# Patient Record
Sex: Female | Born: 1990 | Race: White | Hispanic: No | Marital: Married | Smoking: Never smoker
Health system: Southern US, Community
[De-identification: ages and names within clinical notes are randomized; demographics above are authoritative.]

## PROBLEM LIST (undated history)

## (undated) DIAGNOSIS — Z9889 Other specified postprocedural states: Secondary | ICD-10-CM

## (undated) DIAGNOSIS — R112 Nausea with vomiting, unspecified: Secondary | ICD-10-CM

## (undated) DIAGNOSIS — O2230 Deep phlebothrombosis in pregnancy, unspecified trimester: Secondary | ICD-10-CM

## (undated) DIAGNOSIS — E079 Disorder of thyroid, unspecified: Secondary | ICD-10-CM

## (undated) HISTORY — DX: Other specified postprocedural states: Z98.890

## (undated) HISTORY — DX: Deep phlebothrombosis in pregnancy, unspecified trimester: O22.30

## (undated) HISTORY — PX: FOOT SURGERY: SHX648

## (undated) HISTORY — DX: Other specified postprocedural states: R11.2

---

## 2013-04-08 DIAGNOSIS — F411 Generalized anxiety disorder: Secondary | ICD-10-CM | POA: Insufficient documentation

## 2018-04-05 DIAGNOSIS — D069 Carcinoma in situ of cervix, unspecified: Secondary | ICD-10-CM | POA: Insufficient documentation

## 2020-06-30 ENCOUNTER — Other Ambulatory Visit: Payer: Self-pay

## 2020-06-30 ENCOUNTER — Ambulatory Visit (HOSPITAL_COMMUNITY)
Admission: EM | Admit: 2020-06-30 | Discharge: 2020-06-30 | Disposition: A | Discharge status: Home / Self Care | Payer: 59

## 2020-06-30 ENCOUNTER — Encounter (HOSPITAL_COMMUNITY): Payer: Self-pay | Admitting: Emergency Medicine

## 2020-06-30 ENCOUNTER — Emergency Department (HOSPITAL_COMMUNITY)
Admission: EM | Admit: 2020-06-30 | Discharge: 2020-06-30 | Disposition: A | Discharge status: Home / Self Care | Payer: 59 | Attending: Emergency Medicine | Admitting: Emergency Medicine

## 2020-06-30 DIAGNOSIS — M79604 Pain in right leg: Secondary | ICD-10-CM | POA: Diagnosis present

## 2020-06-30 HISTORY — DX: Disorder of thyroid, unspecified: E07.9

## 2020-06-30 NOTE — Discharge Instructions (Signed)
Please read and follow all provided instructions.  Your diagnoses today include:  1. Right leg pain     Tests performed today include: Vital signs. See below for your results today.   Medications prescribed:  None  Take any prescribed medications only as directed.  Additional Information:  Follow any educational materials contained in this packet.  Although you appear stable for discharge, you may still have a blood clot in your leg(s) called a DVT (deep venous thrombosis). You may have received initial treatment with an injection of a blood thinner to treat DVTs, but low risk patients do not always get treated before an ultrasound is performed to diagnose or rule out a DVT, due to the risks of bleeding from the medication used. It is important you follow up for an ultrasound within one day as directed.  Follow-up instructions: Please return to the hospital tomorrow morning for your ultrasound as directed.  Return instructions:  Please return to the Emergency Department if you experience worsening symptoms. Return immediately if you develop chest pain, shortness of breath, dizziness or fainting. Return with new color change or weakness/numbness to your affected leg(s). Return with bleeding, severe headache, confusion or altered mental status. Please return if you have any other emergent concerns.  Additional Information:  Your vital signs today were: BP 132/77 (BP Location: Right Arm)   Pulse 87   Temp 98.7 F (37.1 C) (Oral)   Resp 15   SpO2 100%  If your blood pressure (BP) was elevated above 135/85 this visit, please have this repeated by your doctor within one month. --------------

## 2020-06-30 NOTE — ED Provider Notes (Signed)
[facility]-EMERGENCY DEPT Provider Note   CSN: [id number] Arrival date & time: 06/30/20  1829     History Chief Complaint  Patient presents with   Leg Pain    Kathryn Gill is a 30 y.o. female.  Patient presents the emergency department for evaluation of right leg discomfort.  Patient states that she has noted an indentation in the right thigh area over the past 1 week.  She denies trauma to the area.  Today she had pain and discomfort in his area as well as down to her calf.  No significant swelling.  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  No chest pain or shortness of breath. Patient denies risk factors for pulmonary embolism/DVT including: unilateral leg swelling, history of DVT/PE/other blood clots, use of exogenous hormones, recent immobilizations, recent surgery, recent travel (>4hr segment), malignancy, hemoptysis.  She went to urgent care prior to arrival.        Past Medical History:  Diagnosis Date   Thyroid disease     There are no problems to display for this patient.   History reviewed. No pertinent surgical history.   OB History   No obstetric history on file.     No family history on file.  Social History   Tobacco Use   Smoking status: Never   Smokeless tobacco: Never  Substance Use Topics   Alcohol use: Yes   Drug use: Never    Home Medications Prior to Admission medications   Medication Sig Start Date End Date Taking? Authorizing Provider  levothyroxine (SYNTHROID) 25 MCG tablet Take by mouth. 06/12/20 06/12/21  [provider]    Allergies    Penicillins  Review of Systems   Review of Systems  Cardiovascular:  Negative for leg swelling.  Musculoskeletal:  Positive for myalgias.  Skin:  Negative for color change.   Physical Exam Updated Vital Signs BP 132/77 (BP Location: Right Arm)   Pulse 87   Temp 98.7 F (37.1 C) (Oral)   Resp 15   SpO2 100%   Physical Exam Vitals and nursing note reviewed.   Constitutional:      Appearance: She is well-developed.  HENT:     Head: Normocephalic and atraumatic.  Eyes:     Conjunctiva/sclera: Conjunctivae normal.  Pulmonary:     Effort: No respiratory distress.  Musculoskeletal:        General: No tenderness.     Cervical back: Normal range of motion and neck supple.     Comments: Patient with a small varicosity in the area where she has noted the indentation, but no definite skin changes otherwise.  Leg is not grossly swollen.  No other significant calf tenderness or thigh tenderness on exam.  No erythema or signs of cellulitis/abscess.  Skin:    General: Skin is warm and dry.  Neurological:     Mental Status: She is alert.    ED Results / Procedures / Treatments   Labs (all labs ordered are listed, but only abnormal results are displayed) Labs Reviewed - No data to display  EKG None  Radiology No results found.  Procedures Procedures   Medications Ordered in ED Medications - No data to display  ED Course  I have reviewed the triage vital signs and the nursing notes.  Pertinent labs & imaging results that were available during my care of the patient were reviewed by me and considered in my medical decision making (see chart for details).  Patient seen and examined.  Unable to obtain Doppler ultrasound as it is past 7 PM.  Patient will be scheduled for imaging tomorrow.  We discussed giving a dose of subcu Lovenox, however she is very low risk and does not have significant risk factors for DVT.  She defers prophylactic treatment at this time and I think this is reasonable.  Vital signs reviewed and are as follows: BP 132/77 (BP Location: Right Arm)   Pulse 87   Temp 98.7 F (37.1 C) (Oral)   Resp 15   SpO2 100%     MDM Rules/Calculators/A&P                          Patient with right leg discomfort, rule out DVT.  Plan as above.  She looks well.   Final Clinical Impression(s) / ED Diagnoses Final diagnoses:   Right leg pain    Rx / DC Orders ED Discharge Orders          Ordered    LE VENOUS        06/30/20 1934             Renne Crigler, Cordelia Poche 06/30/20 1939    Gwyneth Sprout, MD 07/01/20 [phone]

## 2020-06-30 NOTE — ED Triage Notes (Signed)
Patient reports indention to right leg x1 week. States today she noticed pain at site and down leg.

## 2020-06-30 NOTE — ED Provider Notes (Signed)
Emergency Medicine Provider Triage Evaluation Note  Kathryn Gill , a 30 y.o. female  was evaluated in triage.  Pt complains of right thigh and right calf pain.  Denies any mechanical trauma, but she works all day as a Engineer, civil (consulting) in the maternity ward.  She says that about a week ago she noticed an indentation to her thigh      she did not have any difficulty walking or ambulating but she does have right hip pain as well that started today and is intermittent.   She does not smoke, no recent travel, not on oral birth control, no recent surgeries.  Review of Systems  Positive: Right hip pain, right thigh pain, right calf pain Negative: No chest pain, shortness of breath  Physical Exam  BP 132/77 (BP Location: Right Arm)   Pulse 87   Temp 98.7 F (37.1 C) (Oral)   Resp 15   SpO2 100%  Gen:   Awake, no distress   Resp:  Normal effort  MSK:   Moves extremities without difficulty  Other:  No calf swelling.  Homan negative.  DP and PT pulses 2+ bilaterally.  No point tenderness to hip, knee, ankle.  Medical Decision Making  Medically screening exam initiated at 7:06 PM.  Appropriate orders placed.  Kathryn Gill was informed that the remainder of the evaluation will be completed by another provider, this initial triage assessment does not replace that evaluation, and the importance of remaining in the ED until their evaluation is complete.  Unable to order venous ultrasound because the tech is left already.  She was evaluated at urgent care a few hours before coming to the emergency department.   Theron Arista, PA-C 06/30/20 1909    Tilden Fossa, MD 07/01/20 [phone]

## 2020-06-30 NOTE — ED Provider Notes (Signed)
[facility]    CSN: [id number] Arrival date & time: 06/30/20  1650      History   Chief Complaint Chief Complaint  Patient presents with   Thigh Pain    HPI Kathryn Gill is a 30 y.o. female.   Patient here for evaluation of left thigh, knee, and calf pain that has been intermittent for the past several days.  Reports first noticed thigh swelling approximately a week ago.  Denies any numbness or tingling.  Reports pain is intermittent but does not appear to be related to movement.  Denies any trauma, injury, or other precipitating event.  Denies any specific alleviating or aggravating factors.  Denies any fevers, chest pain, shortness of breath, N/V/D, numbness, tingling, weakness, abdominal pain, or headaches.     The history is provided by the patient.   Past Medical History:  Diagnosis Date   Thyroid disease     There are no problems to display for this patient.   History reviewed. No pertinent surgical history.  OB History   No obstetric history on file.      Home Medications    Prior to Admission medications   Medication Sig Start Date End Date Taking? Authorizing Provider  levothyroxine (SYNTHROID) 25 MCG tablet Take by mouth. 06/12/20 06/12/21 Yes [provider]    Family History History reviewed. No pertinent family history.  Social History Social History   Tobacco Use   Smoking status: Never   Smokeless tobacco: Never  Substance Use Topics   Alcohol use: Yes   Drug use: Never     Allergies   Penicillins   Review of Systems Review of Systems  Musculoskeletal:  Positive for joint swelling and myalgias.  All other systems reviewed and are negative.   Physical Exam Triage Vital Signs ED Triage Vitals  Enc Vitals Group     BP 06/30/20 1722 128/83     Pulse Rate 06/30/20 1722 91     Resp 06/30/20 1722 16     Temp 06/30/20 1722 98 F (36.7 C)     Temp Source 06/30/20 1722 Oral     SpO2 06/30/20 1722 100 %     Weight  --      Height --      Head Circumference --      Peak Flow --      Pain Score 06/30/20 1719 3     Pain Loc --      Pain Edu? --      Excl. in GC? --    No data found.  Updated Vital Signs BP 128/83 (BP Location: Right Arm)   Pulse 91   Temp 98 F (36.7 C) (Oral)   Resp 16   SpO2 100%   Breastfeeding Yes   Visual Acuity Right Eye Distance:   Left Eye Distance:   Bilateral Distance:    Right Eye Near:   Left Eye Near:    Bilateral Near:     Physical Exam Vitals and nursing note reviewed.  Constitutional:      General: She is not in acute distress.    Appearance: Normal appearance. She is not ill-appearing, toxic-appearing or diaphoretic.  HENT:     Head: Normocephalic and atraumatic.  Eyes:     Conjunctiva/sclera: Conjunctivae normal.  Cardiovascular:     Rate and Rhythm: Normal rate.     Pulses: Normal pulses.  Pulmonary:     Effort: Pulmonary effort is normal.  Abdominal:     General:  Abdomen is flat.  Musculoskeletal:        General: Normal range of motion.     Cervical back: Normal range of motion.     Right upper leg: No lacerations, tenderness or bony tenderness.     Left upper leg: No deformity, lacerations, tenderness or bony tenderness.     Right knee: No swelling or bony tenderness. Normal range of motion. No tenderness.     Instability Tests: Lateral McMurray test negative.     Left knee: Normal. No swelling or bony tenderness. Normal range of motion. No tenderness.     Right lower leg: Normal. No swelling. No edema.     Left lower leg: Normal. No swelling. No edema.     Comments: Right thigh slightly larger than left, no ecchymosis, redness, or erythema noted Homan's sign negative  Skin:    General: Skin is warm and dry.  Neurological:     General: No focal deficit present.     Mental Status: She is alert and oriented to person, place, and time.  Psychiatric:        Mood and Affect: Mood normal.     UC Treatments / Results  Labs (all  labs ordered are listed, but only abnormal results are displayed) Labs Reviewed - No data to display  EKG   Radiology No results found.  Procedures Procedures (including critical care time)  Medications Ordered in UC Medications - No data to display  Initial Impression / Assessment and Plan / UC Course  I have reviewed the triage vital signs and the nursing notes.  Pertinent labs & imaging results that were available during my care of the patient were reviewed by me and considered in my medical decision making (see chart for details).  Assessment negative for red flags or concerns.  Based on Wells criteria likelihood of a DVT is low.  Discussed this with patient but stated that an ultrasound is the only way to definitively rule out a DVT.  At this point recommend ibuprofen 3 times a day as needed for pain and swelling.  Encourage rest, ice, compression, and elevation.  Patient may follow-up with PCP or go to the emergency room for further evaluation if symptoms do not improve in the next few days. Final Clinical Impressions(s) / UC Diagnoses   Final diagnoses:  Pain of right lower extremity     Discharge Instructions      Take Ibuprofen (600-800mg ) three times a day as needed for pain and swelling.    Rest as much as possible Ice for 10-15 minutes every 4-6 hours as needed for pain and swelling Compression- use an ace bandage or splint for comfort Elevate above your hip/heart when sitting and laying down  Return or go to the Emergency Department if symptoms worsen or do not improve in the next few days.      ED Prescriptions   None    PDMP not reviewed this encounter.   Ivette Loyal, NP 06/30/20 1831

## 2020-06-30 NOTE — Discharge Instructions (Addendum)
Take Ibuprofen (600-800mg ) three times a day as needed for pain and swelling.    Rest as much as possible Ice for 10-15 minutes every 4-6 hours as needed for pain and swelling Compression- use an ace bandage or splint for comfort Elevate above your hip/heart when sitting and laying down  Return or go to the Emergency Department if symptoms worsen or do not improve in the next few days.

## 2020-06-30 NOTE — ED Triage Notes (Signed)
Pt presents with leg pain xs 1 week. States feels an "indentation" in her right thigh that seems to have gotten worse. States is have pain in her knee and calf. Denies and numbness.

## 2020-07-01 ENCOUNTER — Other Ambulatory Visit: Payer: Self-pay

## 2020-07-01 ENCOUNTER — Ambulatory Visit (HOSPITAL_BASED_OUTPATIENT_CLINIC_OR_DEPARTMENT_OTHER)
Admission: RE | Admit: 2020-07-01 | Discharge: 2020-07-01 | Disposition: A | Discharge status: Home / Self Care | Payer: 59 | Source: Ambulatory Visit

## 2020-07-01 ENCOUNTER — Encounter (HOSPITAL_COMMUNITY): Payer: Self-pay | Admitting: Pharmacy Technician

## 2020-07-01 ENCOUNTER — Emergency Department (HOSPITAL_COMMUNITY)
Admission: EM | Admit: 2020-07-01 | Discharge: 2020-07-01 | Disposition: A | Discharge status: Home / Self Care | Payer: 59 | Attending: Emergency Medicine | Admitting: Emergency Medicine

## 2020-07-01 DIAGNOSIS — M7989 Other specified soft tissue disorders: Secondary | ICD-10-CM | POA: Diagnosis present

## 2020-07-01 DIAGNOSIS — M79661 Pain in right lower leg: Secondary | ICD-10-CM

## 2020-07-01 DIAGNOSIS — I82442 Acute embolism and thrombosis of left tibial vein: Secondary | ICD-10-CM | POA: Diagnosis not present

## 2020-07-01 DIAGNOSIS — I82441 Acute embolism and thrombosis of right tibial vein: Secondary | ICD-10-CM

## 2020-07-01 DIAGNOSIS — I82409 Acute embolism and thrombosis of unspecified deep veins of unspecified lower extremity: Secondary | ICD-10-CM | POA: Insufficient documentation

## 2020-07-01 DIAGNOSIS — I82401 Acute embolism and thrombosis of unspecified deep veins of right lower extremity: Secondary | ICD-10-CM | POA: Insufficient documentation

## 2020-07-01 LAB — I-STAT BETA HCG BLOOD, ED (MC, WL, AP ONLY): I-stat hCG, quantitative: <5 m[IU]/mL (ref <5)

## 2020-07-01 MED ORDER — RIVAROXABAN (XARELTO) VTE STARTER PACK (15 & 20 MG): ORAL_TABLET | ORAL | 0 refills | Status: DC

## 2020-07-01 NOTE — ED Provider Notes (Signed)
[facility] EMERGENCY DEPARTMENT Provider Note   CSN: [id number] Arrival date & time: 07/01/20  1157     History No chief complaint on file.   Elvis Boot is a 30 y.o. female.  Worsening leg swelling after the last few weeks.  Is 38 weeks postpartum.  No recent pregnancy that she knows of does not use birth control is sexually active.  No shortness of breath no chest pain.  No significant color change.  Was seen yesterday ultrasound was unavailable she went home and came back today for ultrasound and was told it was positive and come back here.  She denies numbness tingling color change or trauma.  No family history of patient risk factor revealed no provoked factors to cause DVT       Past Medical History:  Diagnosis Date   Thyroid disease     There are no problems to display for this patient.   History reviewed. No pertinent surgical history.   OB History   No obstetric history on file.     No family history on file.  Social History   Tobacco Use   Smoking status: Never   Smokeless tobacco: Never  Substance Use Topics   Alcohol use: Yes   Drug use: Never    Home Medications Prior to Admission medications   Medication Sig Start Date End Date Taking? Authorizing Provider  RIVAROXABAN Carlena Hurl) VTE STARTER PACK (15 & 20 MG) Follow package directions: Take one 15mg  tablet by mouth twice a day. On day 22, switch to one 20mg  tablet once a day. Take with food. 07/01/20  Yes , MD  levothyroxine (SYNTHROID) 25 MCG tablet Take by mouth. 06/12/20 06/12/21  [provider]    Allergies    Penicillins  Review of Systems   Review of Systems  Constitutional:  Negative for chills and fever.  HENT:  Negative for congestion and rhinorrhea.   Respiratory:  Negative for cough and shortness of breath.   Cardiovascular:  Positive for leg swelling. Negative for chest pain and palpitations.  Gastrointestinal:  Negative for diarrhea, nausea  and vomiting.  Genitourinary:  Negative for difficulty urinating and dysuria.  Musculoskeletal:  Negative for arthralgias and back pain.  Skin:  Negative for rash and wound.  Neurological:  Negative for light-headedness and headaches.   Physical Exam Updated Vital Signs BP 140/77 (BP Location: Right Arm)   Pulse 81   Temp 98.4 F (36.9 C)   Resp 18   SpO2 99%   Physical Exam Vitals and nursing note reviewed. Exam conducted with a chaperone present.  Constitutional:      General: She is not in acute distress.    Appearance: Normal appearance.  HENT:     Head: Normocephalic and atraumatic.     Nose: No rhinorrhea.  Eyes:     General:        Right eye: No discharge.        Left eye: No discharge.     Conjunctiva/sclera: Conjunctivae normal.  Cardiovascular:     Rate and Rhythm: Normal rate and regular rhythm.  Pulmonary:     Effort: Pulmonary effort is normal. No respiratory distress.     Breath sounds: No stridor.  Abdominal:     General: Abdomen is flat. There is no distension.     Palpations: Abdomen is soft.  Musculoskeletal:        General: No tenderness or signs of injury.     Comments: Slightly larger  circumferential diameter of the right lower extremity compared to the left.  Intact neurovascular status of both legs no bony tenderness normal range of motion.  No significant color change  Skin:    General: Skin is warm and dry.  Neurological:     General: No focal deficit present.     Mental Status: She is alert. Mental status is at baseline.     Motor: No weakness.  Psychiatric:        Mood and Affect: Mood normal.        Behavior: Behavior normal.    ED Results / Procedures / Treatments   Labs (all labs ordered are listed, but only abnormal results are displayed) Labs Reviewed  I-STAT BETA HCG BLOOD, ED ([facility], [facility], [facility] ONLY)    EKG None  Radiology LE VENOUS  Result Date: 07/01/2020  Lower Venous DVT Study Patient Name:  Fiana Rinella  Date of Exam:    07/01/2020 Medical Rec #: [id number]    Accession #:    [id number] Date of Birth: 1990/04/07    Patient Gender: F Patient Age:   63Y Exam Location:  [facility] Procedure:      VAS [place] LOWER EXTREMITY VENOUS (DVT) Referring Phys: [id number] JOSHUA GEIPLE --------------------------------------------------------------------------------  Indications: Right leg discomfort from lateral thigh into calf. No edema is appreciated in the calf.  Comparison Study: No prior study on file Performing Technologist: Sherren Kerns RVS  Examination Guidelines: A complete evaluation includes B-mode imaging, spectral Doppler, color Doppler, and power Doppler as needed of all accessible portions of each vessel. Bilateral testing is considered an integral part of a complete examination. Limited examinations for reoccurring indications may be performed as noted. The reflux portion of the exam is performed with the patient in reverse Trendelenburg.  +---------+---------------+---------+-----------+----------+-------------------+ RIGHT    CompressibilityPhasicitySpontaneityPropertiesThrombus Aging      +---------+---------------+---------+-----------+----------+-------------------+ CFV      Full           Yes      Yes                                      +---------+---------------+---------+-----------+----------+-------------------+ SFJ      Full                                                             +---------+---------------+---------+-----------+----------+-------------------+ FV Prox  Full                                                             +---------+---------------+---------+-----------+----------+-------------------+ FV Mid   Full                                                             +---------+---------------+---------+-----------+----------+-------------------+ FV DistalFull                                                              +---------+---------------+---------+-----------+----------+-------------------+  PFV      Full                                                             +---------+---------------+---------+-----------+----------+-------------------+ POP      Full           Yes      Yes                                      +---------+---------------+---------+-----------+----------+-------------------+ PTV      None                                         acute in one of the                                                       paired veins        +---------+---------------+---------+-----------+----------+-------------------+ PERO     None                                         acute in one of the                                                       paired veins        +---------+---------------+---------+-----------+----------+-------------------+   +----+---------------+---------+-----------+----------+--------------+ LEFTCompressibilityPhasicitySpontaneityPropertiesThrombus Aging +----+---------------+---------+-----------+----------+--------------+ CFV Full           Yes      Yes                                 +----+---------------+---------+-----------+----------+--------------+     Summary: RIGHT: - Findings consistent with acute deep vein thrombosis involving one of the paired veins of the right posterior tibial veins, and right peroneal veins. LEFT: - No evidence of common femoral vein obstruction.  *See table(s) above for measurements and observations.    Preliminary     Procedures Procedures   Medications Ordered in ED Medications - No data to display  ED Course  I have reviewed the triage vital signs and the nursing notes.  Pertinent labs & imaging results that were available during my care of the patient were reviewed by me and considered in my medical decision making (see chart for details).    MDM Rules/Calculators/A&P                           Vascular study consistent with DVT.  Patient is to give [place] pregnancy test and determine best course of treatment.  She has breast-feeding literature review shows safety of Xarelto Lovenox.  If pregnant she will likely  get Lovenox if not she will likely get Xarelto  Pregnancy test is negative.  Xarelto starter pack prescribed.  Return precautions discussed Final Clinical Impression(s) / ED Diagnoses Final diagnoses:  Acute deep vein thrombosis (DVT) of tibial vein of right lower extremity (HCC)    Rx / DC Orders ED Discharge Orders          Ordered    RIVAROXABAN (XARELTO) VTE STARTER PACK (15 & 20 MG)        07/01/20 1427             Sabino DonovanKatz, Zain Bingman C, MD 07/01/20 1429

## 2020-07-01 NOTE — ED Notes (Signed)
ED Provider at bedside. 

## 2020-07-01 NOTE — ED Triage Notes (Signed)
Pt here with reports of R sided leg swelling and pain, increased over the last few days. Pt sent here for [place] that is +DVT. Pt denies chest pain/shob.

## 2020-07-01 NOTE — Progress Notes (Signed)
VASCULAR LAB    Right lower extremity venous duplex has been performed.  See CV proc for preliminary results.   Gave verbal report to triage nurse.  Jolie Strohecker, RVT 07/01/2020, 12:13 PM

## 2020-07-02 ENCOUNTER — Other Ambulatory Visit: Payer: Self-pay

## 2020-07-02 ENCOUNTER — Encounter (HOSPITAL_COMMUNITY): Payer: Self-pay

## 2020-07-02 ENCOUNTER — Emergency Department (HOSPITAL_COMMUNITY)
Admission: EM | Admit: 2020-07-02 | Discharge: 2020-07-02 | Disposition: A | Discharge status: Home / Self Care | Payer: 59 | Attending: Emergency Medicine | Admitting: Emergency Medicine

## 2020-07-02 ENCOUNTER — Emergency Department (HOSPITAL_COMMUNITY): Payer: 59

## 2020-07-02 DIAGNOSIS — R079 Chest pain, unspecified: Secondary | ICD-10-CM | POA: Diagnosis present

## 2020-07-02 DIAGNOSIS — R0789 Other chest pain: Secondary | ICD-10-CM | POA: Insufficient documentation

## 2020-07-02 DIAGNOSIS — Z7901 Long term (current) use of anticoagulants: Secondary | ICD-10-CM | POA: Insufficient documentation

## 2020-07-02 DIAGNOSIS — I824Z1 Acute embolism and thrombosis of unspecified deep veins of right distal lower extremity: Secondary | ICD-10-CM | POA: Diagnosis not present

## 2020-07-02 DIAGNOSIS — R0602 Shortness of breath: Secondary | ICD-10-CM | POA: Insufficient documentation

## 2020-07-02 DIAGNOSIS — I82401 Acute embolism and thrombosis of unspecified deep veins of right lower extremity: Secondary | ICD-10-CM

## 2020-07-02 LAB — COMPREHENSIVE METABOLIC PANEL
ALT: 18 U/L (ref 0–44)
AST: 20 U/L (ref 15–41)
Albumin: 4.9 g/dL (ref 3.5–5.0)
Alkaline Phosphatase: 61 U/L (ref 38–126)
Anion gap: 7 (ref 5–15)
BUN: 12 mg/dL (ref 6–20)
CO2: 27 mmol/L (ref 22–32)
Calcium: 9.5 mg/dL (ref 8.9–10.3)
Chloride: 105 mmol/L (ref 98–111)
Creatinine, Ser: 0.7 mg/dL (ref 0.44–1.00)
GFR, Estimated: >60 mL/min (ref >60)
Glucose, Bld: 94 mg/dL (ref 70–99)
Potassium: 3.7 mmol/L (ref 3.5–5.1)
Sodium: 139 mmol/L (ref 135–145)
Total Bilirubin: 0.9 mg/dL (ref 0.3–1.2)
Total Protein: 8 g/dL (ref 6.5–8.1)

## 2020-07-02 LAB — URINALYSIS, ROUTINE W REFLEX MICROSCOPIC
Bilirubin Urine: NEGATIVE
Glucose, UA: NEGATIVE mg/dL
Hgb urine dipstick: NEGATIVE
Ketones, ur: 20 mg/dL — AB
Leukocytes,Ua: NEGATIVE
Nitrite: NEGATIVE
Protein, ur: NEGATIVE mg/dL
Specific Gravity, Urine: 1.012 (ref 1.005–1.030)
pH: 7 (ref 5.0–8.0)

## 2020-07-02 LAB — CBC WITH DIFFERENTIAL/PLATELET
Abs Immature Granulocytes: 0.02 10*3/uL (ref 0.00–0.07)
Basophils Absolute: 0.1 10*3/uL (ref 0.0–0.1)
Basophils Relative: 1 %
Eosinophils Absolute: 0.2 10*3/uL (ref 0.0–0.5)
Eosinophils Relative: 3 %
HCT: 43.9 % (ref 36.0–46.0)
Hemoglobin: 14.3 g/dL (ref 12.0–15.0)
Immature Granulocytes: 0 %
Lymphocytes Relative: 25 %
Lymphs Abs: 2 10*3/uL (ref 0.7–4.0)
MCH: 29.1 pg (ref 26.0–34.0)
MCHC: 32.6 g/dL (ref 30.0–36.0)
MCV: 89.2 fL (ref 80.0–100.0)
Monocytes Absolute: 0.4 10*3/uL (ref 0.1–1.0)
Monocytes Relative: 5 %
Neutro Abs: 5.3 10*3/uL (ref 1.7–7.7)
Neutrophils Relative %: 66 %
Platelets: 276 10*3/uL (ref 150–400)
RBC: 4.92 MIL/uL (ref 3.87–5.11)
RDW: 13.1 % (ref 11.5–15.5)
WBC: 8 10*3/uL (ref 4.0–10.5)
nRBC: 0 % (ref 0.0–0.2)

## 2020-07-02 LAB — LIPASE, BLOOD: Lipase: 36 U/L (ref 11–51)

## 2020-07-02 LAB — I-STAT BETA HCG BLOOD, ED (MC, WL, AP ONLY): I-stat hCG, quantitative: <5 m[IU]/mL (ref <5)

## 2020-07-02 LAB — TROPONIN I (HIGH SENSITIVITY): Troponin I (High Sensitivity): <2 ng/L (ref <18)

## 2020-07-02 MED ORDER — IOHEXOL 350 MG/ML SOLN
100.0000 mL | Freq: Once | INTRAVENOUS | Status: AC | PRN
  Administered 2020-07-02: 100 mL via INTRAVENOUS

## 2020-07-02 MED ORDER — SODIUM CHLORIDE (PF) 0.9 % IJ SOLN
INTRAMUSCULAR | Status: AC
  Filled 2020-07-02: qty 50

## 2020-07-02 NOTE — ED Provider Notes (Signed)
[facility]-EMERGENCY DEPT Provider Note   CSN: [id number] Arrival date & time: 07/02/20  1646     History Chief Complaint  Patient presents with   Chest Pain   Shortness of Breath    Kathryn Gill is a 30 y.o. female with a past medical history of thyroid disease, diagnosed yesterday with lower extremity DVT and started on Xarelto, who presents today for evaluation of chest pain. She states that she took her doses of Xarelto at 4 PM yesterday, 4 AM today, and 4 PM today and shortly before she took her 4 PM when she developed pain in the left side of her chest.  She denies any shortness of breath. She is a Engineer, civil (consulting)nurse in the NICU.  She states that when she had this pain she felt her heart rate and it was irregular without a specific pattern.  She feels like that has resolved.  During that she had shortness of breath.  Her chest still feels painful.  Does not radiate or move.   HPI     Past Medical History:  Diagnosis Date   Thyroid disease     There are no problems to display for this patient.   History reviewed. No pertinent surgical history.   OB History   No obstetric history on file.     History reviewed. No pertinent family history.  Social History   Tobacco Use   Smoking status: Never   Smokeless tobacco: Never  Substance Use Topics   Alcohol use: Yes   Drug use: Never    Home Medications Prior to Admission medications   Medication Sig Start Date End Date Taking? Authorizing Provider  levothyroxine (SYNTHROID) 25 MCG tablet Take by mouth. 06/12/20 06/12/21  [provider]  RIVAROXABAN Carlena Hurl(XARELTO) VTE STARTER PACK (15 & 20 MG) Follow package directions: Take one 15mg  tablet by mouth twice a day. On day 22, switch to one 20mg  tablet once a day. Take with food. 07/01/20   Sabino DonovanKatz, Eric C, MD    Allergies    Penicillins  Review of Systems   Review of Systems  Constitutional:  Negative for chills and fever.  Respiratory:  Positive for  shortness of breath.   Cardiovascular:  Positive for chest pain, palpitations and leg swelling.  Gastrointestinal:  Negative for diarrhea, nausea and vomiting.  Musculoskeletal:  Positive for back pain.  Neurological:  Negative for weakness and headaches.  Psychiatric/Behavioral:  The patient is nervous/anxious.   All other systems reviewed and are negative.  Physical Exam Updated Vital Signs BP 110/74   Pulse 87   Temp 98.2 F (36.8 C) (Oral)   Resp 16   Ht 5\' 7"  (1.702 m)   Wt 63.5 kg   SpO2 98%   Breastfeeding Yes   BMI 21.93 kg/m   Physical Exam Vitals and nursing note reviewed.  Constitutional:      General: She is not in acute distress.    Appearance: She is not diaphoretic.  HENT:     Head: Normocephalic and atraumatic.  Eyes:     General: No scleral icterus.       Right eye: No discharge.        Left eye: No discharge.     Conjunctiva/sclera: Conjunctivae normal.  Cardiovascular:     Rate and Rhythm: Normal rate and regular rhythm.     Heart sounds: Normal heart sounds. No murmur heard.    Comments: When patient starts talking about anxiety producing topics (such as her having  a child at home and this being the first time she has had health issues) she briefly becomes tachycardic into the 140s, this promptly resolved with distraction.  Pulmonary:     Effort: Pulmonary effort is normal. No respiratory distress.     Breath sounds: No stridor.  Abdominal:     General: There is no distension.  Musculoskeletal:        General: No deformity.     Cervical back: Normal range of motion and neck supple.     Right lower leg: Edema present.     Left lower leg: No edema.  Skin:    General: Skin is warm and dry.  Neurological:     General: No focal deficit present.     Mental Status: She is alert.     Motor: No abnormal muscle tone.  Psychiatric:        Mood and Affect: Mood is anxious.    ED Results / Procedures / Treatments   Labs (all labs ordered are listed,  but only abnormal results are displayed) Labs Reviewed  URINALYSIS, ROUTINE W REFLEX MICROSCOPIC - Abnormal; Notable for the following components:      Result Value   Color, Urine STRAW (*)    Ketones, ur 20 (*)    All other components within normal limits  COMPREHENSIVE METABOLIC PANEL  LIPASE, BLOOD  CBC WITH DIFFERENTIAL/PLATELET  I-STAT BETA HCG BLOOD, ED ([facility], [facility], [facility] ONLY)  TROPONIN I (HIGH SENSITIVITY)  TROPONIN I (HIGH SENSITIVITY)    EKG None  Radiology DG Chest 2 View  Result Date: 07/02/2020 CLINICAL DATA:  Chest pain, DVT EXAM: CHEST - 2 VIEW COMPARISON:  None. FINDINGS: The heart size and mediastinal contours are within normal limits. No focal airspace consolidation, pleural effusion, or pneumothorax. The visualized skeletal structures are unremarkable. IMPRESSION: No active cardiopulmonary disease. Electronically Signed   By: Duanne Guess D.O.   On: 07/02/2020 18:10   CT Angio Chest PE W and/or Wo Contrast  Result Date: 07/02/2020 CLINICAL DATA:  Sudden onset chest pain. Shortness of breath. Diagnosed with deep venous thrombosis yesterday and on blood thinners. Pulmonary embolus is suspected with high probability. EXAM: CT ANGIOGRAPHY CHEST WITH CONTRAST TECHNIQUE: Multidetector CT imaging of the chest was performed using the standard protocol during bolus administration of intravenous contrast. Multiplanar CT image reconstructions and MIPs were obtained to evaluate the vascular anatomy. CONTRAST:  OMNIPAQUE IOHEXOL 350 MG/ML SOLN COMPARISON:  Chest radiograph 07/02/2020 FINDINGS: Cardiovascular: Satisfactory opacification of the pulmonary arteries to the segmental level. No evidence of pulmonary embolism. Normal heart size. No pericardial effusion. Mediastinum/Nodes: No enlarged mediastinal, hilar, or axillary lymph nodes. Thyroid gland, trachea, and esophagus demonstrate no significant findings. Lungs/Pleura: Lungs are clear. No pleural effusion or pneumothorax. Upper  Abdomen: No acute abnormality. Musculoskeletal: No chest wall abnormality. No acute or significant osseous findings. Review of the MIP images confirms the above findings. IMPRESSION: No evidence of significant pulmonary embolus. No evidence of active pulmonary disease. Electronically Signed   By: Burman Nieves M.D.   On: 07/02/2020 19:49   LE VENOUS  Result Date: 07/02/2020  Lower Venous DVT Study Patient Name:  Triana Ursua  Date of Exam:   07/01/2020 Medical Rec #: [id number]    Accession #:    [id number] Date of Birth: [birth year]    Patient Gender: F Patient Age:   29Y Exam Location:  [facility] Procedure:      VAS [place] LOWER EXTREMITY VENOUS (DVT) Referring Phys: [id number] JOSHUA  GEIPLE --------------------------------------------------------------------------------  Indications: Right leg discomfort from lateral thigh into calf. No edema is appreciated in the calf.  Comparison Study: No prior study on file Performing Technologist: Sherren Kerns RVS  Examination Guidelines: A complete evaluation includes B-mode imaging, spectral Doppler, color Doppler, and power Doppler as needed of all accessible portions of each vessel. Bilateral testing is considered an integral part of a complete examination. Limited examinations for reoccurring indications may be performed as noted. The reflux portion of the exam is performed with the patient in reverse Trendelenburg.  +---------+---------------+---------+-----------+----------+-------------------+ RIGHT    CompressibilityPhasicitySpontaneityPropertiesThrombus Aging      +---------+---------------+---------+-----------+----------+-------------------+ CFV      Full           Yes      Yes                                      +---------+---------------+---------+-----------+----------+-------------------+ SFJ      Full                                                              +---------+---------------+---------+-----------+----------+-------------------+ FV Prox  Full                                                             +---------+---------------+---------+-----------+----------+-------------------+ FV Mid   Full                                                             +---------+---------------+---------+-----------+----------+-------------------+ FV DistalFull                                                             +---------+---------------+---------+-----------+----------+-------------------+ PFV      Full                                                             +---------+---------------+---------+-----------+----------+-------------------+ POP      Full           Yes      Yes                                      +---------+---------------+---------+-----------+----------+-------------------+ PTV      None  acute in one of the                                                       paired veins        +---------+---------------+---------+-----------+----------+-------------------+ PERO     None                                         acute in one of the                                                       paired veins        +---------+---------------+---------+-----------+----------+-------------------+   +----+---------------+---------+-----------+----------+--------------+ LEFTCompressibilityPhasicitySpontaneityPropertiesThrombus Aging +----+---------------+---------+-----------+----------+--------------+ CFV Full           Yes      Yes                                 +----+---------------+---------+-----------+----------+--------------+     Summary: RIGHT: - Findings consistent with acute deep vein thrombosis involving one of the paired veins of the right posterior tibial veins, and right peroneal veins. LEFT: - No evidence of common femoral vein  obstruction.  *See table(s) above for measurements and observations. Electronically signed by Heath Lark on 07/02/2020 at 10:50:41 AM.    Final     Procedures Procedures   Medications Ordered in ED Medications  sodium chloride (PF) 0.9 % injection (has no administration in time range)  iohexol (OMNIPAQUE) 350 MG/ML injection 100 mL (100 mLs Intravenous Contrast Given 07/02/20 1936)    ED Course  I have reviewed the triage vital signs and the nursing notes.  Pertinent labs & imaging results that were available during my care of the patient were reviewed by me and considered in my medical decision making (see chart for details).    MDM Rules/Calculators/A&P                          Patient is a 30 year old woman who presents today for evaluation of chest pain.  She was diagnosed with a DVT yesterday, has been taking her anticoagulants when she had a sudden onset of chest pain with shortness of breath.  Given chest pain with known DVT concern for PE. CBC and CMP are unremarkable.  Lipase was added on given that she reported some epigastric pain without elevation.  Troponin is not elevated and pregnancy test is negative.  UA was obtained without evidence of infection.  Patient is not tachycardic or hypoxic.  Chest x-ray is obtained without acute abnormalities.  EKG without ischemia. PE study is obtained without PE, consolidation, pneumothorax or other cause for patient's symptoms found. Recommended outpatient follow-up.  It appears that patient does have a component of anxiety contributing to her symptoms and we discussed stress management.  Return precautions were discussed with patient who states their understanding.  At the time of discharge patient denied any unaddressed complaints or concerns.  Patient is agreeable for discharge home.  Note: Portions of  this report may have been transcribed using voice recognition software. Every effort was made to ensure accuracy; however, inadvertent  computerized transcription errors may be present   Final Clinical Impression(s) / ED Diagnoses Final diagnoses:  Atypical chest pain  Acute deep vein thrombosis (DVT) of right lower extremity, unspecified vein [facility])    Rx / DC Orders ED Discharge Orders     None        Norman Clay 07/03/20 0009    Benjiman Core, MD 07/03/20 1454

## 2020-07-02 NOTE — Discharge Instructions (Addendum)
Today your blood work was reassuring.  Your CAT scan did not show any PE.,  Your electrolytes, kidney and liver functions were normal and your heart enzyme, troponin, was not elevated. I would recommend trying a bland diet for the next few days to see if that helps your upper abdominal pain. If you have any new or concerning symptoms please seek additional medical care and evaluation. Please follow-up with your primary care doctor.

## 2020-07-02 NOTE — ED Triage Notes (Signed)
Pt states she was dx with DVT yesterday, is taking a blood thinner. Pt states Today she has noticed that she has SHOB and CP. Pt is A&Ox4.

## 2021-03-28 DIAGNOSIS — E039 Hypothyroidism, unspecified: Secondary | ICD-10-CM | POA: Insufficient documentation

## 2022-01-16 ENCOUNTER — Inpatient Hospital Stay (HOSPITAL_COMMUNITY)
Admission: AD | Admit: 2022-01-16 | Discharge: 2022-01-16 | Disposition: A | Discharge status: Home / Self Care | Payer: 59 | Attending: Obstetrics and Gynecology | Admitting: Obstetrics and Gynecology

## 2022-01-16 ENCOUNTER — Encounter (HOSPITAL_COMMUNITY): Payer: Self-pay | Admitting: *Deleted

## 2022-01-16 DIAGNOSIS — Z3A08 8 weeks gestation of pregnancy: Secondary | ICD-10-CM | POA: Insufficient documentation

## 2022-01-16 DIAGNOSIS — Z86718 Personal history of other venous thrombosis and embolism: Secondary | ICD-10-CM | POA: Insufficient documentation

## 2022-01-16 DIAGNOSIS — Z3490 Encounter for supervision of normal pregnancy, unspecified, unspecified trimester: Secondary | ICD-10-CM

## 2022-01-16 DIAGNOSIS — O209 Hemorrhage in early pregnancy, unspecified: Secondary | ICD-10-CM

## 2022-01-16 DIAGNOSIS — Z79899 Other long term (current) drug therapy: Secondary | ICD-10-CM | POA: Insufficient documentation

## 2022-01-16 LAB — POCT PREGNANCY, URINE: Preg Test, Ur: POSITIVE — AB

## 2022-01-16 NOTE — MAU Note (Addendum)
Wynelle Bourgeois CNM in Triage to see pt and discuss plan of care. CNM did bedside u/s and pt reassured. Pt d/c home from room by CNM

## 2022-01-16 NOTE — Progress Notes (Signed)
Wynelle Bourgeois CNM d/c pt home after bedside u/s

## 2022-01-16 NOTE — MAU Provider Note (Signed)
Chief Complaint: Vaginal Discharge   Event Date/Time   First Provider Initiated Contact with Patient 01/16/22 2235        SUBJECTIVE HPI: Kathryn Gill is a 31 y.o. G3P1011 at [redacted]w[redacted]d weeks by LMP who presents to maternity admissions reporting brown vaginal discharge today.  Was worried because she is on Lovenox (hx DVT x 2).  Has had [place] in office confirming viable SIUP. Marland Kitchen She denies vaginal itching/burning, urinary symptoms, h/a, dizziness, n/v, or fever/chills.    Vaginal Discharge The patient's primary symptoms include vaginal bleeding and vaginal discharge. The patient's pertinent negatives include no genital itching, genital odor or pelvic pain. This is a new problem. The current episode started today. The patient is experiencing no pain. She is pregnant. Pertinent negatives include no abdominal pain, back pain, chills, fever or nausea. The vaginal discharge was brown. She has not been passing clots. She has not been passing tissue. Nothing aggravates the symptoms. She has tried nothing for the symptoms.   RN Note: Ronald Vinsant is a 31 y.o. at Unknown here in MAU reporting brown vag d/c at 2030. No pain and no recent intercourse. Has started [facility] and reports having a normal u/s at office with no concerns. On Lovenox due to hx DVTs after delivery with first child LMP: 10/29 Onset of complaint: 2030  Pain score: 0  Past Medical History:  Diagnosis Date   Thyroid disease    No past surgical history on file. Social History   Socioeconomic History   Marital status: Unknown    Spouse name: Not on file   Number of children: Not on file   Years of education: Not on file   Highest education level: Not on file  Occupational History   Not on file  Tobacco Use   Smoking status: Never   Smokeless tobacco: Never  Substance and Sexual Activity   Alcohol use: Yes   Drug use: Never   Sexual activity: Not on file  Other Topics Concern   Not on file  Social History Narrative   Not on file    Social Determinants of Health   Financial Resource Strain: Not on file  Food Insecurity: Not on file  Transportation Needs: Not on file  Physical Activity: Not on file  Stress: Not on file  Social Connections: Not on file  Intimate Partner Violence: Not on file   No current facility-administered medications on file prior to encounter.   Current Outpatient Medications on File Prior to Encounter  Medication Sig Dispense Refill   levothyroxine (SYNTHROID) 25 MCG tablet Take by mouth.     RIVAROXABAN (XARELTO) VTE STARTER PACK (15 & 20 MG) Follow package directions: Take one 15mg  tablet by mouth twice a day. On day 22, switch to one 20mg  tablet once a day. Take with food. 51 each 0   Allergies  Allergen Reactions   Penicillins Anaphylaxis    I have reviewed patient's Past Medical Hx, Surgical Hx, Family Hx, Social Hx, medications and allergies.   ROS:  Review of Systems  Constitutional:  Negative for chills and fever.  Gastrointestinal:  Negative for abdominal pain and nausea.  Genitourinary:  Positive for vaginal discharge. Negative for pelvic pain.  Musculoskeletal:  Negative for back pain.   Review of Systems  Other systems negative   Physical Exam  Physical Exam Patient Vitals for the past 24 hrs:  BP Temp Pulse Resp SpO2 Height Weight  01/16/22 2138 122/73 -- -- -- -- -- --  01/16/22 2137 --  97.9 F (36.6 C) 97 17 100 % 5\' 7"  (1.702 m) 68.5 kg   Constitutional: Well-developed, well-nourished female in no acute distress.  Cardiovascular: normal rate Respiratory: normal effort GI: Abd soft, non-tender.  MS: Extremities nontender, no edema, normal ROM Neurologic: Alert and oriented x 4.  GU: Neg CVAT.  PELVIC EXAM: deferred in lieu of ultrasound  LAB RESULTS Results for orders placed or performed during the hospital encounter of 01/16/22 (from the past 24 hour(s))  Pregnancy, urine POC     Status: Abnormal   Collection Time: 01/16/22  9:50 PM  Result Value  Ref Range   Preg Test, Ur POSITIVE (A) NEGATIVE       IMAGING Pt informed that the ultrasound is considered a limited OB ultrasound and is not intended to be a complete ultrasound exam.  Patient also informed that the ultrasound is not being completed with the intent of assessing for fetal or placental anomalies or any pelvic abnormalities.  Explained that the purpose of today's ultrasound is to assess for viability .  Patient acknowledges the purpose of the exam and the limitations of the study.    Normal Gestational Sac visualized Single LIve Fetus noted  FHR 160s CRL c/w dates  MAU Management/MDM: I have reviewed the triage vital signs and the nursing notes.   Pertinent labs & imaging results that were available during my care of the patient were reviewed by me and considered in my medical decision making (see chart for details).      I have reviewed her medical records including past results, notes and treatments.   Treatments in MAU included bedside 01/18/22.   This bleeding/pain can represent a normal pregnancy with bleeding, spontaneous abortion or even an ectopic which can be life-threatening.  The process as listed above helps to determine which of these is present.  DIscussed brown discharge reflects old blood which may have resulted from implantation or cervix bleeding.  Since fetus is still living, bleeding was likely inconsequential   ASSESSMENT Single IUP at [redacted]w[redacted]d by LMP Brown discharge Live single intrauterine fetus  PLAN Discharge home Recommend pelvic rest for a week or so Followup at [facility] office   Pt stable at time of discharge. Encouraged to return here if she develops worsening of symptoms, increase in pain, fever, or other concerning symptoms.    [facility] CNM, MSN Certified Nurse-Midwife 01/16/2022  10:35 PM

## 2022-01-16 NOTE — MAU Note (Addendum)
.  Roselina Scalisi is a 31 y.o. at Unknown here in MAU reporting brown vag d/c at 2030. No pain and no recent intercourse. Has started [facility] and reports having a normal u/s at office with no concerns. On Lovenox due to hx DVTs after delivery with first child LMP: 10/29 Onset of complaint: 2030  Pain score: 0 Vitals:   01/16/22 2137 01/16/22 2138  BP:  122/73  Pulse: 97   Resp: 17   Temp: 97.9 F (36.6 C)   SpO2: 100%      FHT:n/a Lab orders placed from triage:  upt

## 2022-01-17 ENCOUNTER — Encounter: Payer: Self-pay | Admitting: Advanced Practice Midwife

## 2022-01-20 NOTE — L&D Delivery Note (Signed)
Delivery Note At 11:41 AM a viable female "Ozzy" was delivered via Vaginal, Spontaneous (Presentation: Left Occiput Anterior).  APGAR: 8, 9; weight pending.   Placenta status: Spontaneous, Intact.  Cord: 3 vessels with the following complications: None.   Anesthesia: Epidural Episiotomy: None Lacerations: 1st degree Suture Repair: 3.0 vicryl rapide Est. Blood Loss (mL): 107  Fundus firm with bimanual massage and pitocin.   Mom to postpartum.  Baby to Couplet care / Skin to Skin.  Tawni Levy 08/18/2022, 12:05 PM

## 2022-01-21 LAB — OB RESULTS CONSOLE ABO/RH: RH Type: POSITIVE

## 2022-01-21 LAB — OB RESULTS CONSOLE ANTIBODY SCREEN: Antibody Screen: NEGATIVE

## 2022-01-21 LAB — OB RESULTS CONSOLE RPR: RPR: NONREACTIVE

## 2022-01-21 LAB — HEPATITIS C ANTIBODY: HCV Ab: NEGATIVE

## 2022-01-21 LAB — OB RESULTS CONSOLE HIV ANTIBODY (ROUTINE TESTING): HIV: NONREACTIVE

## 2022-01-21 LAB — OB RESULTS CONSOLE HEPATITIS B SURFACE ANTIGEN: Hepatitis B Surface Ag: NEGATIVE

## 2022-01-21 LAB — OB RESULTS CONSOLE RUBELLA ANTIBODY, IGM: Rubella: IMMUNE

## 2022-01-28 LAB — OB RESULTS CONSOLE GC/CHLAMYDIA
Chlamydia: NEGATIVE
Neisseria Gonorrhea: NEGATIVE

## 2022-03-05 ENCOUNTER — Inpatient Hospital Stay (HOSPITAL_COMMUNITY): Payer: 59

## 2022-03-05 ENCOUNTER — Other Ambulatory Visit: Payer: Self-pay

## 2022-03-05 ENCOUNTER — Inpatient Hospital Stay (HOSPITAL_COMMUNITY)
Admission: AD | Admit: 2022-03-05 | Discharge: 2022-03-05 | Disposition: A | Discharge status: Home / Self Care | Payer: 59 | Attending: Obstetrics and Gynecology | Admitting: Obstetrics and Gynecology

## 2022-03-05 ENCOUNTER — Encounter (HOSPITAL_COMMUNITY): Payer: Self-pay | Admitting: Obstetrics and Gynecology

## 2022-03-05 DIAGNOSIS — N3289 Other specified disorders of bladder: Secondary | ICD-10-CM | POA: Diagnosis not present

## 2022-03-05 DIAGNOSIS — R338 Other retention of urine: Secondary | ICD-10-CM | POA: Diagnosis not present

## 2022-03-05 DIAGNOSIS — O26892 Other specified pregnancy related conditions, second trimester: Secondary | ICD-10-CM | POA: Diagnosis present

## 2022-03-05 DIAGNOSIS — Z3A15 15 weeks gestation of pregnancy: Secondary | ICD-10-CM | POA: Diagnosis not present

## 2022-03-05 LAB — COMPREHENSIVE METABOLIC PANEL
ALT: 13 U/L (ref 0–44)
AST: 17 U/L (ref 15–41)
Albumin: 3.7 g/dL (ref 3.5–5.0)
Alkaline Phosphatase: 44 U/L (ref 38–126)
Anion gap: 12 (ref 5–15)
BUN: 5 mg/dL — ABNORMAL LOW (ref 6–20)
CO2: 21 mmol/L — ABNORMAL LOW (ref 22–32)
Calcium: 9.5 mg/dL (ref 8.9–10.3)
Chloride: 101 mmol/L (ref 98–111)
Creatinine, Ser: 0.51 mg/dL (ref 0.44–1.00)
GFR, Estimated: >60 mL/min (ref >60)
Glucose, Bld: 86 mg/dL (ref 70–99)
Potassium: 3.4 mmol/L — ABNORMAL LOW (ref 3.5–5.1)
Sodium: 134 mmol/L — ABNORMAL LOW (ref 135–145)
Total Bilirubin: 0.8 mg/dL (ref 0.3–1.2)
Total Protein: 6.6 g/dL (ref 6.5–8.1)

## 2022-03-05 LAB — URINALYSIS, ROUTINE W REFLEX MICROSCOPIC
Bilirubin Urine: NEGATIVE
Glucose, UA: NEGATIVE mg/dL
Hgb urine dipstick: NEGATIVE
Ketones, ur: 5 mg/dL — AB
Leukocytes,Ua: NEGATIVE
Nitrite: NEGATIVE
Protein, ur: NEGATIVE mg/dL
Specific Gravity, Urine: 1.003 — ABNORMAL LOW (ref 1.005–1.030)
pH: 7 (ref 5.0–8.0)

## 2022-03-05 LAB — CBC
HCT: 39.6 % (ref 36.0–46.0)
Hemoglobin: 13.2 g/dL (ref 12.0–15.0)
MCH: 28.9 pg (ref 26.0–34.0)
MCHC: 33.3 g/dL (ref 30.0–36.0)
MCV: 86.7 fL (ref 80.0–100.0)
Platelets: 255 10*3/uL (ref 150–400)
RBC: 4.57 MIL/uL (ref 3.87–5.11)
RDW: 13.1 % (ref 11.5–15.5)
WBC: 13.6 10*3/uL — ABNORMAL HIGH (ref 4.0–10.5)
nRBC: 0 % (ref 0.0–0.2)

## 2022-03-05 MED ORDER — LACTATED RINGERS IV BOLUS
1000.0000 mL | Freq: Once | INTRAVENOUS | Status: AC
  Administered 2022-03-05: 1000 mL via INTRAVENOUS

## 2022-03-05 MED ORDER — MORPHINE SULFATE (PF) 4 MG/ML IV SOLN: 2.0000 mg | Freq: Once | INTRAVENOUS | Status: DC

## 2022-03-05 NOTE — Discharge Instructions (Signed)
You were seen for acute urinary retention. We are not sure why this is happening. The treatment is to place a foley catheter until early next week. We have spoke with the urology team, they recommend calling them today and scheduling a follow up appointment for early next week. I also recommend you follow up with your primary OB later this week.   If you develop a fever, urine stops draining from the catheter, you have severe abdominal pain, vaginal bleeding, unintentional loss of bowel function, numbness or tingling in your legs, or weakness in your legs, return to the MAU immediately.

## 2022-03-05 NOTE — MAU Provider Note (Signed)
History     [id number]  Arrival date and time: 03/05/22 0932    Chief Complaint  Patient presents with   Back Pain   Urinary Frequency     HPI Kathryn Gill is a 32 y.o. 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   [facility] of [facility] normal new OB labs, normal BP's to date. NICU RN. Currently on Lovenox 100 mg for hx of DVT. Hx of LEEP in 2020. Hypothyroid on synthroid.  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 [facility] to Lovenox 100 mg daily prior to conception.  Today reports that she has had increased urinary frequency for a couple of weeks which she attributed to her pregnancy Starting last night she felt an intense urge to pee, went to the toilet but wasn't able to get anything out This has continued and also progressed to include R sided back pain wrapping around to the front on the R side She has not seen any blood in her urine Does not think she's had loss of fluid but is not sure as her underwear was wet No vaginal bleeding or blood in urine No fevers No personal or family hx of kidney stones Reports she had a corpus luteum cyst but no known large adnexal masses No use of benadryl or other allergy medicines recently Only medicines are prenatals, lovenox, and a probiotic No weakness, numbness, or tingling in her lower extremities     OB History     Gravida  3   Para  1   Term  1   Preterm      AB  1   Living  1      SAB  1   IAB      Ectopic      Multiple      Live Births  1           Past Medical History:  Diagnosis Date   Thyroid disease     History reviewed. No pertinent surgical history.  History reviewed. No pertinent family history.  Social History   Socioeconomic History   Marital  status: Unknown    Spouse name: Not on file   Number of children: Not on file   Years of education: Not on file   Highest education level: Not on file  Occupational History   Not on file  Tobacco Use   Smoking status: Never   Smokeless tobacco: Never  Substance and Sexual Activity   Alcohol use: Not Currently   Drug use: Never   Sexual activity: Not Currently  Other Topics Concern   Not on file  Social History Narrative   Not on file   Social Determinants of Health   Financial Resource Strain: Not on file  Food Insecurity: Not on file  Transportation Needs: Not on file  Physical Activity: Not on file  Stress: Not on file  Social Connections: Not on file  Intimate Partner Violence: Not on file    Allergies  Allergen Reactions   Penicillins Anaphylaxis    No current facility-administered medications on file prior to encounter.   Current Outpatient Medications on File Prior to Encounter  Medication Sig Dispense Refill   enoxaparin (LOVENOX) 100 MG/ML injection Inject 100 mg into the skin.  levothyroxine (SYNTHROID) 25 MCG tablet Take by mouth.       ROS Pertinent positives and negative per HPI, all others reviewed and negative  Physical Exam   BP 121/71 (BP Location: Right Arm)   Pulse 99   Temp 98.4 F (36.9 C) (Oral)   Resp 16   Ht 5' 7"$  (1.702 m)   Wt 67.9 kg   LMP 11/17/2021   SpO2 100% Comment: room air  BMI 23.46 kg/m   Patient Vitals for the past 24 hrs:  BP Temp Temp src Pulse Resp SpO2 Height Weight  03/05/22 0943 121/71 98.4 F (36.9 C) Oral 99 16 100 % -- --  03/05/22 0939 -- -- -- -- -- -- 5' 7"$  (1.702 m) 67.9 kg    Physical Exam Vitals reviewed.  Constitutional:      General: She is not in acute distress.    Appearance: Normal appearance. She is normal weight. She is not ill-appearing, toxic-appearing or diaphoretic.  HENT:     Head: Normocephalic and atraumatic.  Eyes:     General: No scleral icterus. Cardiovascular:     Rate  and Rhythm: Normal rate.  Pulmonary:     Effort: Pulmonary effort is normal. No respiratory distress.  Abdominal:     General: Abdomen is flat. There is distension.     Palpations: There is no mass.     Tenderness: There is abdominal tenderness. There is no guarding.     Comments: Diffuse ttp, worse in lower quadrants and in particular the suprapubic area which is visibly and palpably distended  Genitourinary:    Comments: Cervix visually closed, neg pool, neg valsalva. SVE with cervix external os multiparous and fingertip but internal os closed, cervix long Skin:    General: Skin is warm and dry.  Neurological:     General: No focal deficit present.     Mental Status: She is alert.  Psychiatric:        Mood and Affect: Mood normal.        Behavior: Behavior normal.      Cervical Exam    Bedside Ultrasound Pt informed that the ultrasound is considered a limited OB ultrasound and is not intended to be a complete ultrasound exam.  Patient also informed that the ultrasound is not being completed with the intent of assessing for fetal or placental anomalies or any pelvic abnormalities.  Explained that the purpose of today's ultrasound is to assess for   bladder volume/renal assessment and viability.  Patient acknowledges the purpose of the exam and the limitations of the study.    My interpretation: viable IUP seen. Bladder markedly distended and appears larger in size than uterus. R kidney without any signs of hydronephrosis.   FHT FHR 156 bpm by doppler  Labs Results for orders placed or performed during the hospital encounter of 03/05/22 (from the past 24 hour(s))  Urinalysis, Routine w reflex microscopic -Urine, Clean Catch     Status: Abnormal   Collection Time: 03/05/22 10:01 AM  Result Value Ref Range   Color, Urine STRAW (A) YELLOW   APPearance CLEAR CLEAR   Specific Gravity, Urine 1.003 (L) 1.005 - 1.030   pH 7.0 5.0 - 8.0   Glucose, UA NEGATIVE NEGATIVE mg/dL   Hgb  urine dipstick NEGATIVE NEGATIVE   Bilirubin Urine NEGATIVE NEGATIVE   Ketones, ur 5 (A) NEGATIVE mg/dL   Protein, ur NEGATIVE NEGATIVE mg/dL   Nitrite NEGATIVE NEGATIVE   Leukocytes,Ua NEGATIVE NEGATIVE  CBC  Status: Abnormal   Collection Time: 03/05/22 11:41 AM  Result Value Ref Range   WBC 13.6 (H) 4.0 - 10.5 K/uL   RBC 4.57 3.87 - 5.11 MIL/uL   Hemoglobin 13.2 12.0 - 15.0 g/dL   HCT 39.6 36.0 - 46.0 %   MCV 86.7 80.0 - 100.0 fL   MCH 28.9 26.0 - 34.0 pg   MCHC 33.3 30.0 - 36.0 g/dL   RDW 13.1 11.5 - 15.5 %   Platelets 255 150 - 400 K/uL   nRBC 0.0 0.0 - 0.2 %  Comprehensive metabolic panel     Status: Abnormal   Collection Time: 03/05/22 11:41 AM  Result Value Ref Range   Sodium 134 (L) 135 - 145 mmol/L   Potassium 3.4 (L) 3.5 - 5.1 mmol/L   Chloride 101 98 - 111 mmol/L   CO2 21 (L) 22 - 32 mmol/L   Glucose, Bld 86 70 - 99 mg/dL   BUN 5 (L) 6 - 20 mg/dL   Creatinine, Ser 0.51 0.44 - 1.00 mg/dL   Calcium 9.5 8.9 - 10.3 mg/dL   Total Protein 6.6 6.5 - 8.1 g/dL   Albumin 3.7 3.5 - 5.0 g/dL   AST 17 15 - 41 U/L   ALT 13 0 - 44 U/L   Alkaline Phosphatase 44 38 - 126 U/L   Total Bilirubin 0.8 0.3 - 1.2 mg/dL   GFR, Estimated >60 >60 mL/min   Anion gap 12 5 - 15    Imaging US RENAL  Result Date: 03/05/2022 CLINICAL DATA:  Right flank pain, concern for urinary retention. 15 week of pregnancy. EXAM: RENAL / URINARY TRACT ULTRASOUND COMPLETE COMPARISON:  None Available. FINDINGS: Right Kidney: Renal measurements: 13.2 x 5.0 x 5.4 cm = volume: 185.7 mL. Echogenicity within normal limits. No mass or hydronephrosis visualized. Left Kidney: Renal measurements: 12.8 x 5.6 x 5.1 = volume: 192.4 mL. Echogenicity within normal limits. No mass or hydronephrosis visualized. Bladder: Appears normal for degree of bladder distention. Other: Intrauterine gestation. IMPRESSION: Normal renal ultrasound. Electronically Signed   By: Keane Police D.O.   On: 03/05/2022 13:37    MAU Course   Procedures Lab Orders         Culture, OB Urine         Urinalysis, Routine w reflex microscopic -Urine, Clean Catch         CBC         Comprehensive metabolic panel         Urinalysis, Complete w Microscopic -Urine, Clean Catch    Meds ordered this encounter  Medications   lactated ringers bolus 1,000 mL   DISCONTD: morphine (PF) 4 MG/ML injection 2 mg   Imaging Orders         US RENAL      MDM moderate  Assessment and Plan  #Urinary retention #[redacted] weeks gestation of pregnancy Acute urinary retention of unclear etiology. Initial in and out cath drained 1250 mL. Subsequent void trial was not successful. Discussed with Dr. Alinda Money from urology, agrees it is a strange case given lack of precipitating factors but has no evidence of stones, infection, neurological symptoms, or offending medications. Recommends placement of foley, which has been placed and drained an additional 1L. Discussed keeping this in place, patient to call urology and schedule follow up appt early next week. Also called and made Dr. Bridgett Larsson aware of patient.   #FWB FHR 156 bpm by doppler   Dispo: discharged to home in stable condition.  Clarnce Flock, MD/MPH 03/05/22 2:19 PM  Allergies as of 03/05/2022       Reactions   Penicillins Anaphylaxis        Medication List     TAKE these medications    enoxaparin 100 MG/ML injection Commonly known as: LOVENOX Inject 100 mg into the skin.   levothyroxine 25 MCG tablet Commonly known as: SYNTHROID Take by mouth.

## 2022-03-05 NOTE — MAU Note (Signed)
Kathryn Gill is a 32 y.o. at 39w3dhere in MAU reporting: for the past week has been having frequent urination at night time. Last night was barely able to urinate and was having to strain to pee with little amounts of urine. At 5 AM started having back pain that wraps around to the front of her abdomen on the right side. Around 0920 she noticed some wetness in her underwear, does not think she is still leaking. No bleeding.   Onset of complaint: ongoing  Pain score: 10/10  Vitals:   03/05/22 0943  BP: 121/71  Pulse: 99  Resp: 16  Temp: 98.4 F (36.9 C)  SpO2: 100%     FHT:156  Lab orders placed from triage: UA

## 2022-03-06 LAB — CULTURE, OB URINE: Culture: NO GROWTH

## 2022-06-16 IMAGING — CT CT ANGIO CHEST
2 of 6 series · 18 of 36 positions shown · IV contrast (omnipaque)
Comparison: Chest radiograph 07/02/2020

CLINICAL DATA: Sudden onset chest pain. Shortness of breath.
Diagnosed with deep venous thrombosis yesterday and on blood
thinners. Pulmonary embolus is suspected with high probability.

EXAM:
CT ANGIOGRAPHY CHEST WITH CONTRAST
TECHNIQUE: Multidetector CT imaging of the chest was performed using the
standard protocol during bolus administration of intravenous
contrast. Multiplanar CT image reconstructions and MIPs were
obtained to evaluate the vascular anatomy.
CONTRAST:  100mL OMNIPAQUE IOHEXOL 350 MG/ML SOLN

[Series 8: thins · axial · 0.62mm/px · z∈[+1464,+1690]mm · 17 of 256 slices shown]
[im 15/256  lung]
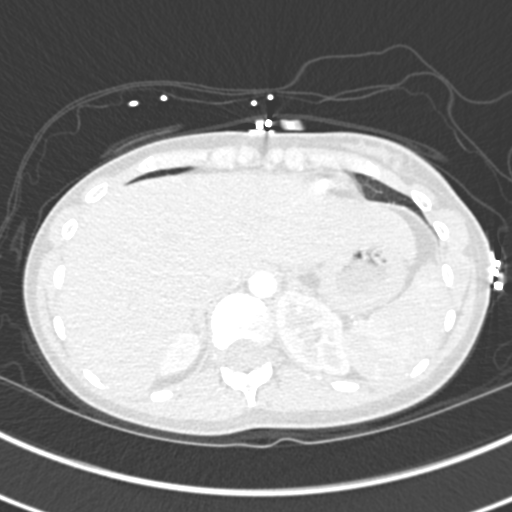
[im 29/256  mediastinal]
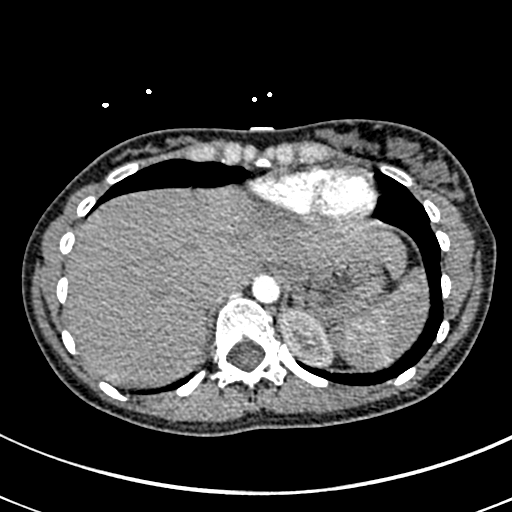
[im 43/256  lung]
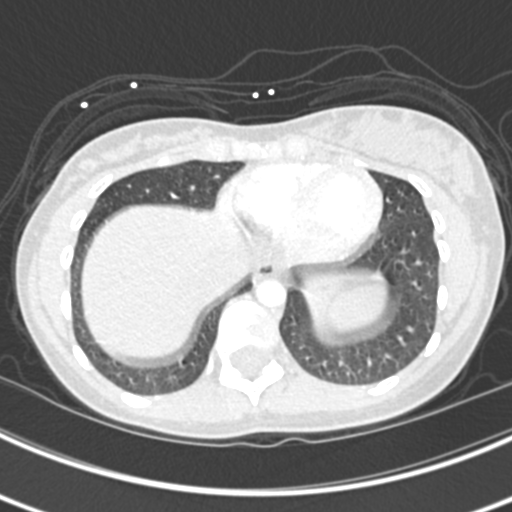
[im 57/256  mediastinal]
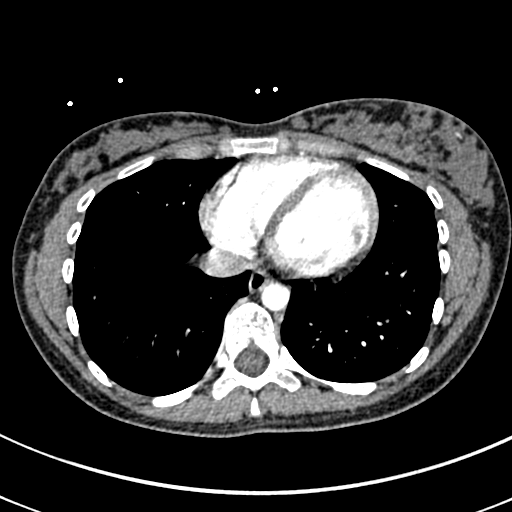
[im 71/256  lung]
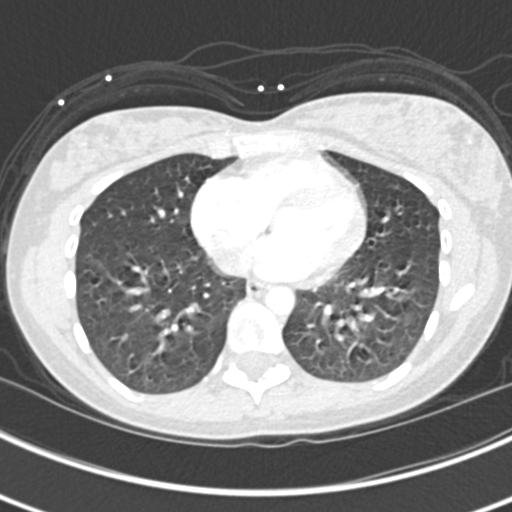
[im 86/256  mediastinal]
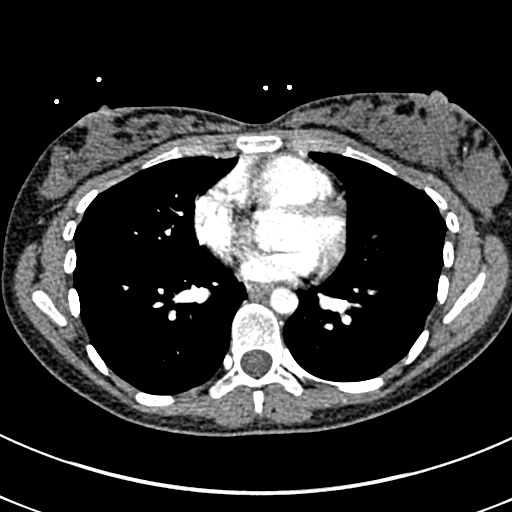
[im 100/256  lung]
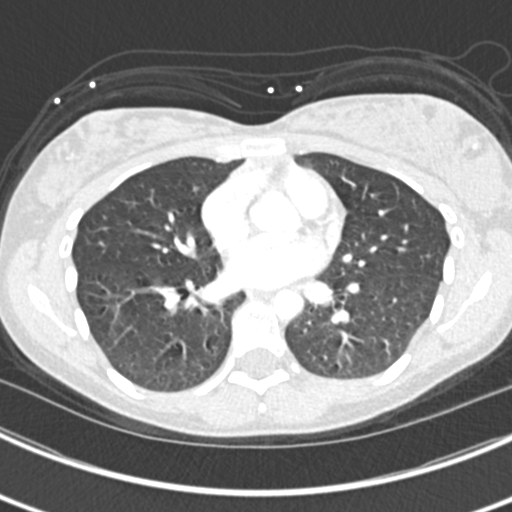
[im 114/256  mediastinal]
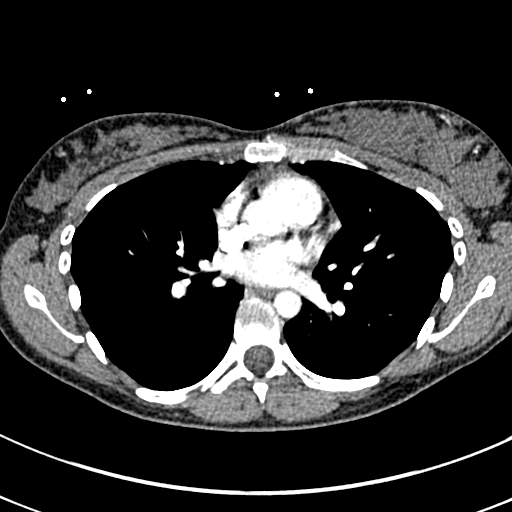
[im 128/256  lung]
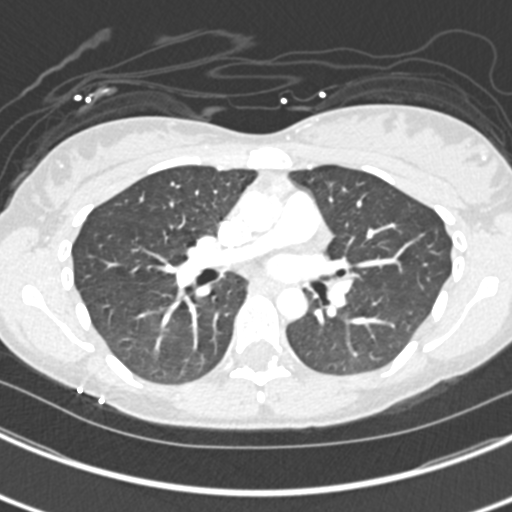
[im 142/256  mediastinal]
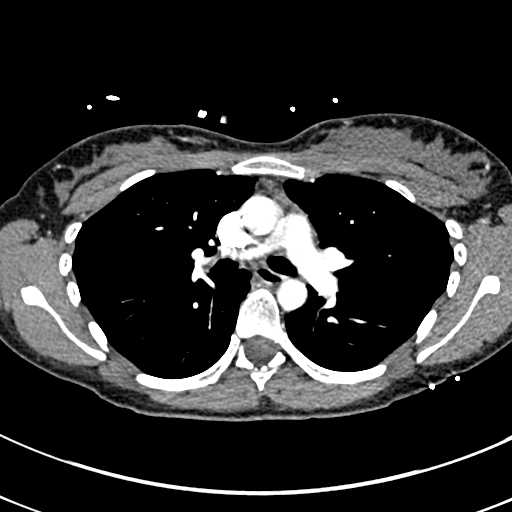
[im 156/256  lung]
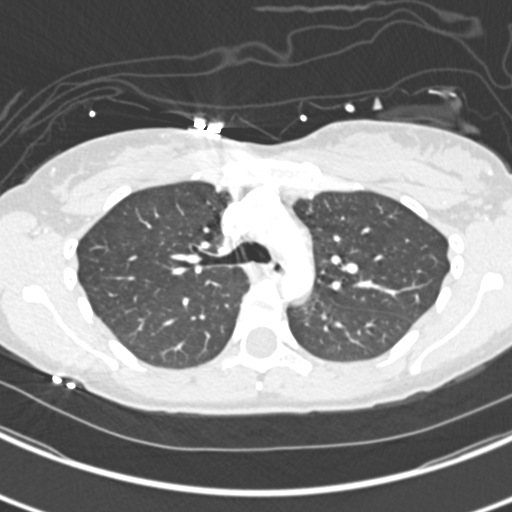
[im 171/256  mediastinal]
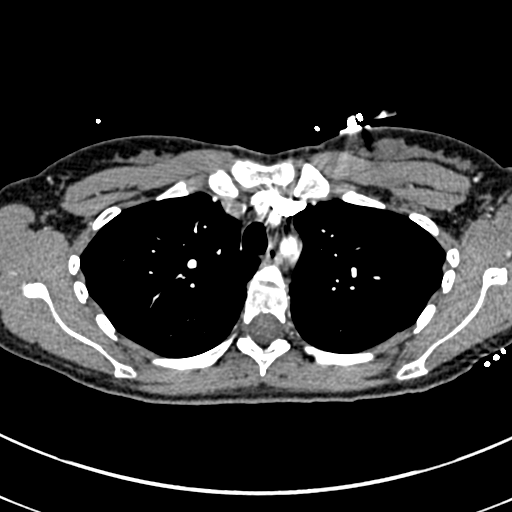
[im 185/256  lung]
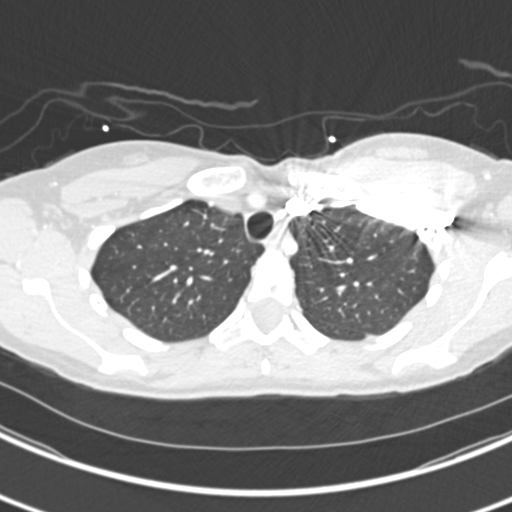
[im 199/256  mediastinal]
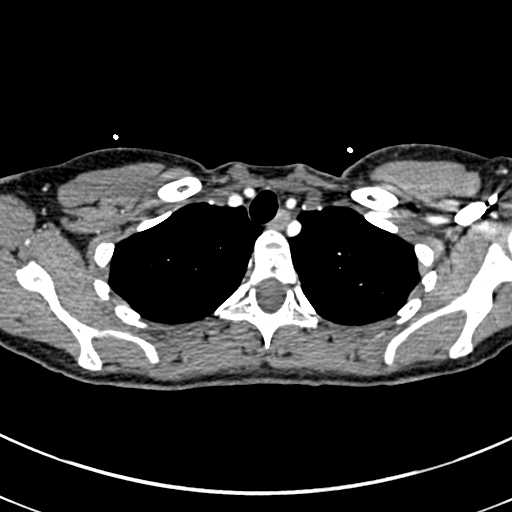
[im 213/256  lung]
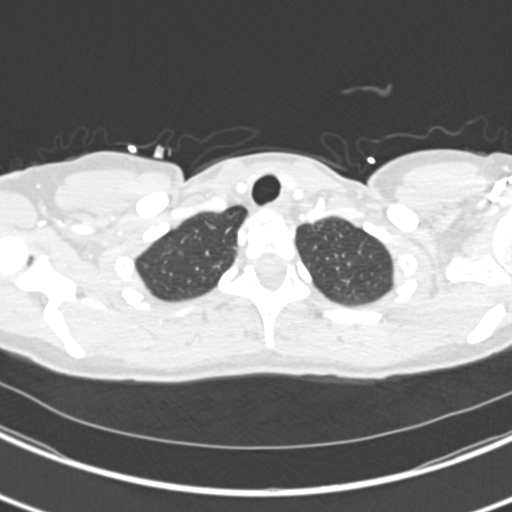
[im 227/256  mediastinal]
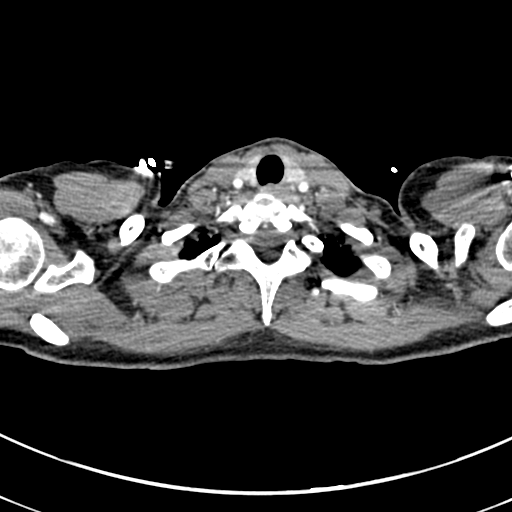
[im 241/256  lung]
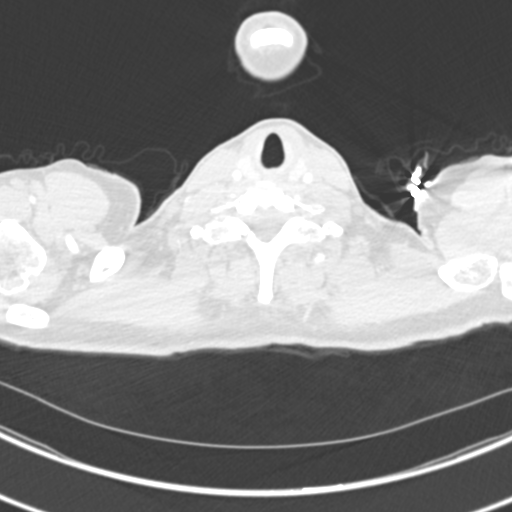

[Series 10: coronal mpr · coronal · 0.51mm/px · 1 of 101 slices shown]
[im 51/101  mediastinal]
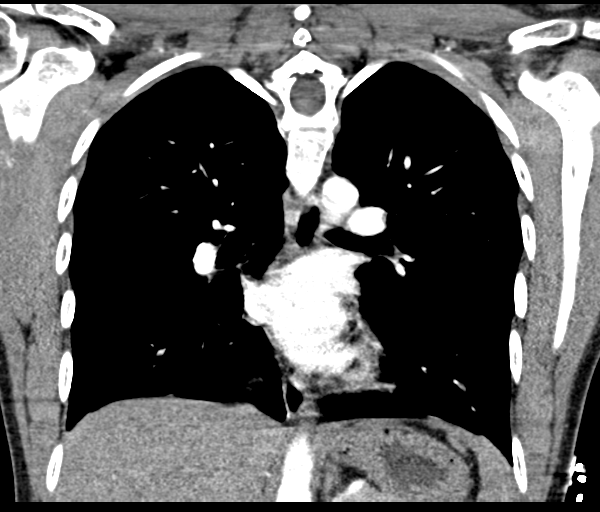

[18 of 36 positions shown; findings below may reference images not displayed]

FINDINGS: Cardiovascular: Satisfactory opacification of the pulmonary arteries
to the segmental level. No evidence of pulmonary embolism. Normal
heart size. No pericardial effusion.

Mediastinum/Nodes: No enlarged mediastinal, hilar, or axillary lymph
nodes. Thyroid gland, trachea, and esophagus demonstrate no
significant findings.

Lungs/Pleura: Lungs are clear. No pleural effusion or pneumothorax.

Upper Abdomen: No acute abnormality.

Musculoskeletal: No chest wall abnormality. No acute or significant
osseous findings.

Review of the MIP images confirms the above findings.
IMPRESSION: No evidence of significant pulmonary embolus. No evidence of active
pulmonary disease.

## 2022-06-16 IMAGING — CR DG CHEST 2V
2 series · 2 of 2 positions shown · non-contrast
Comparison: None.

CLINICAL DATA: Chest pain, DVT

EXAM:
CHEST - 2 VIEW

[w chest pa]
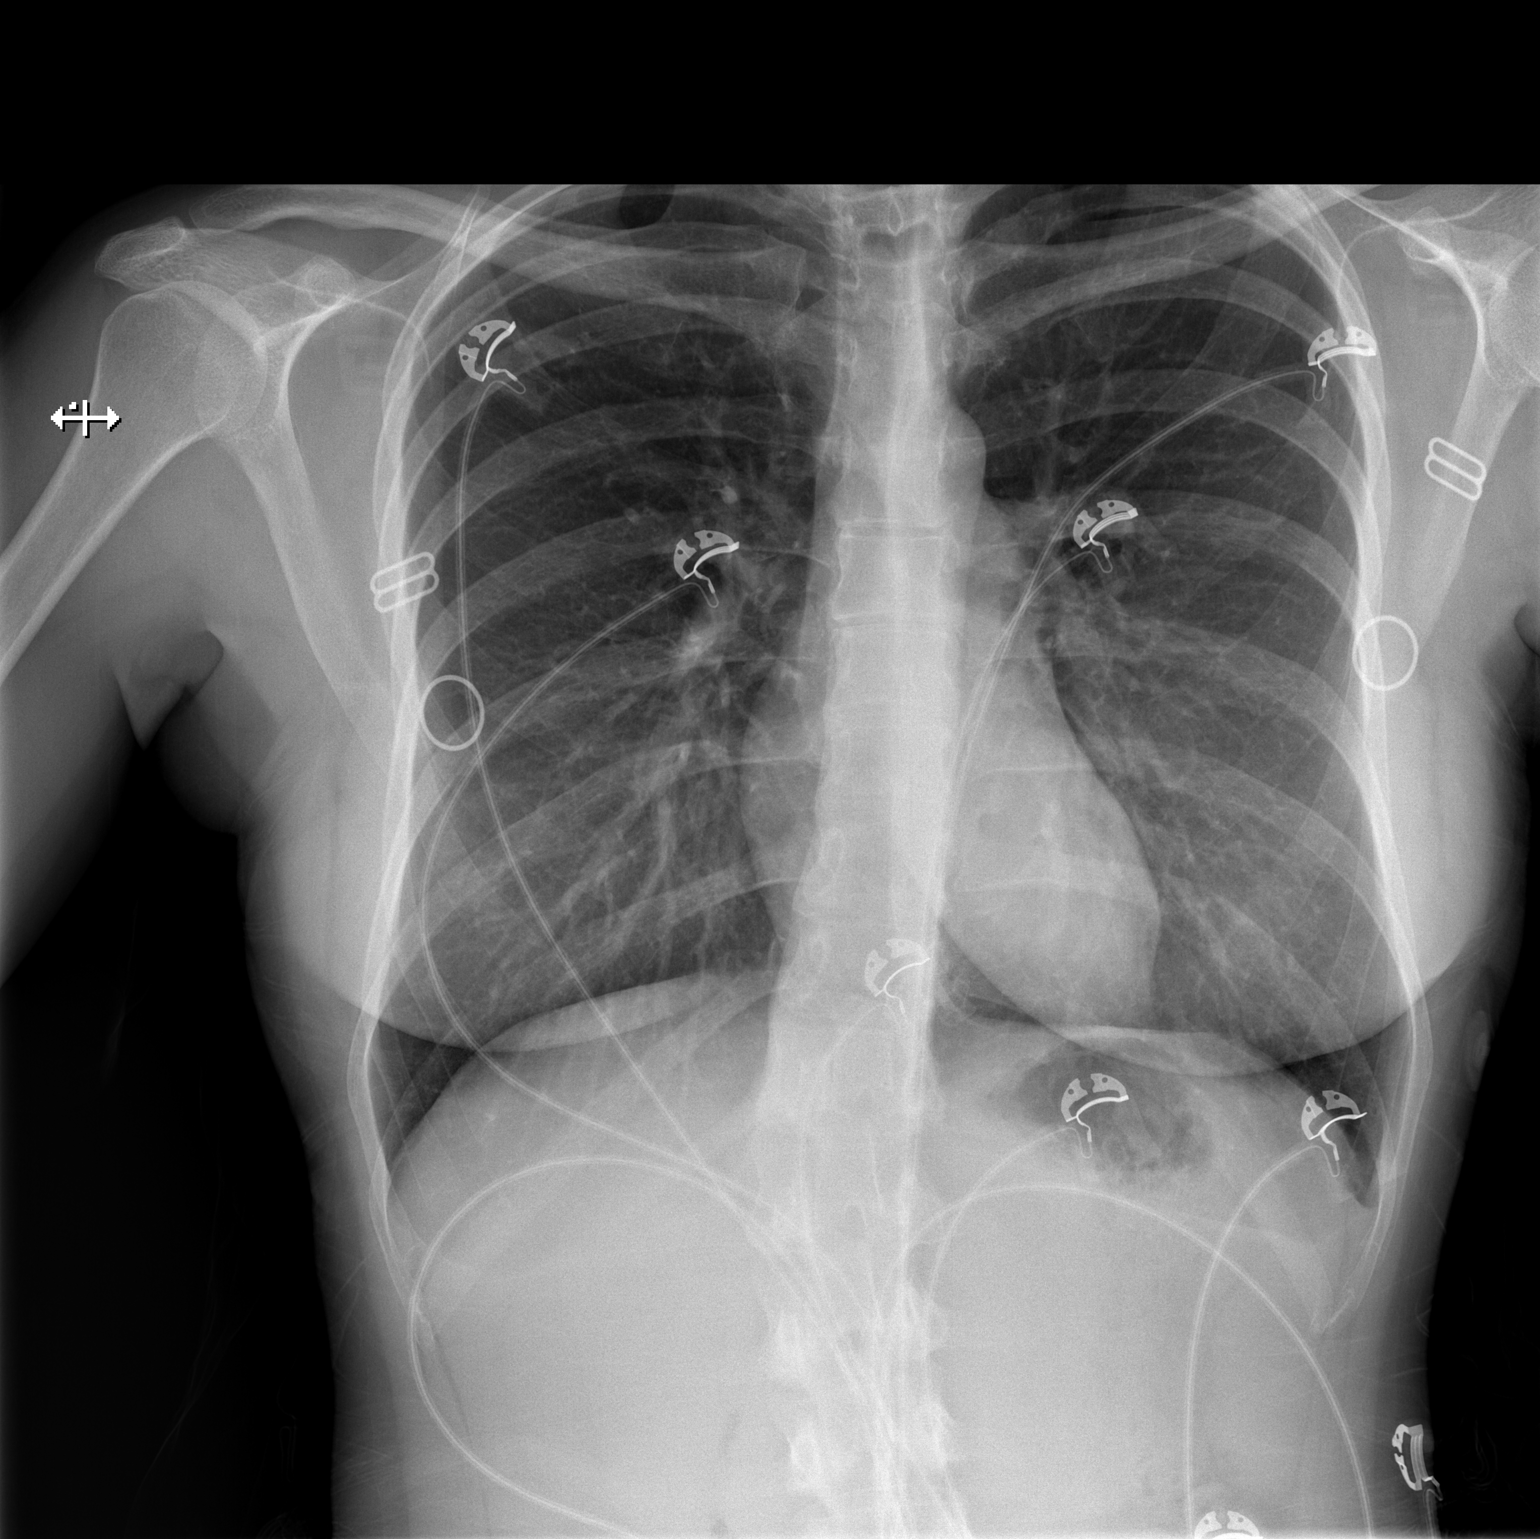

[w chest lat]
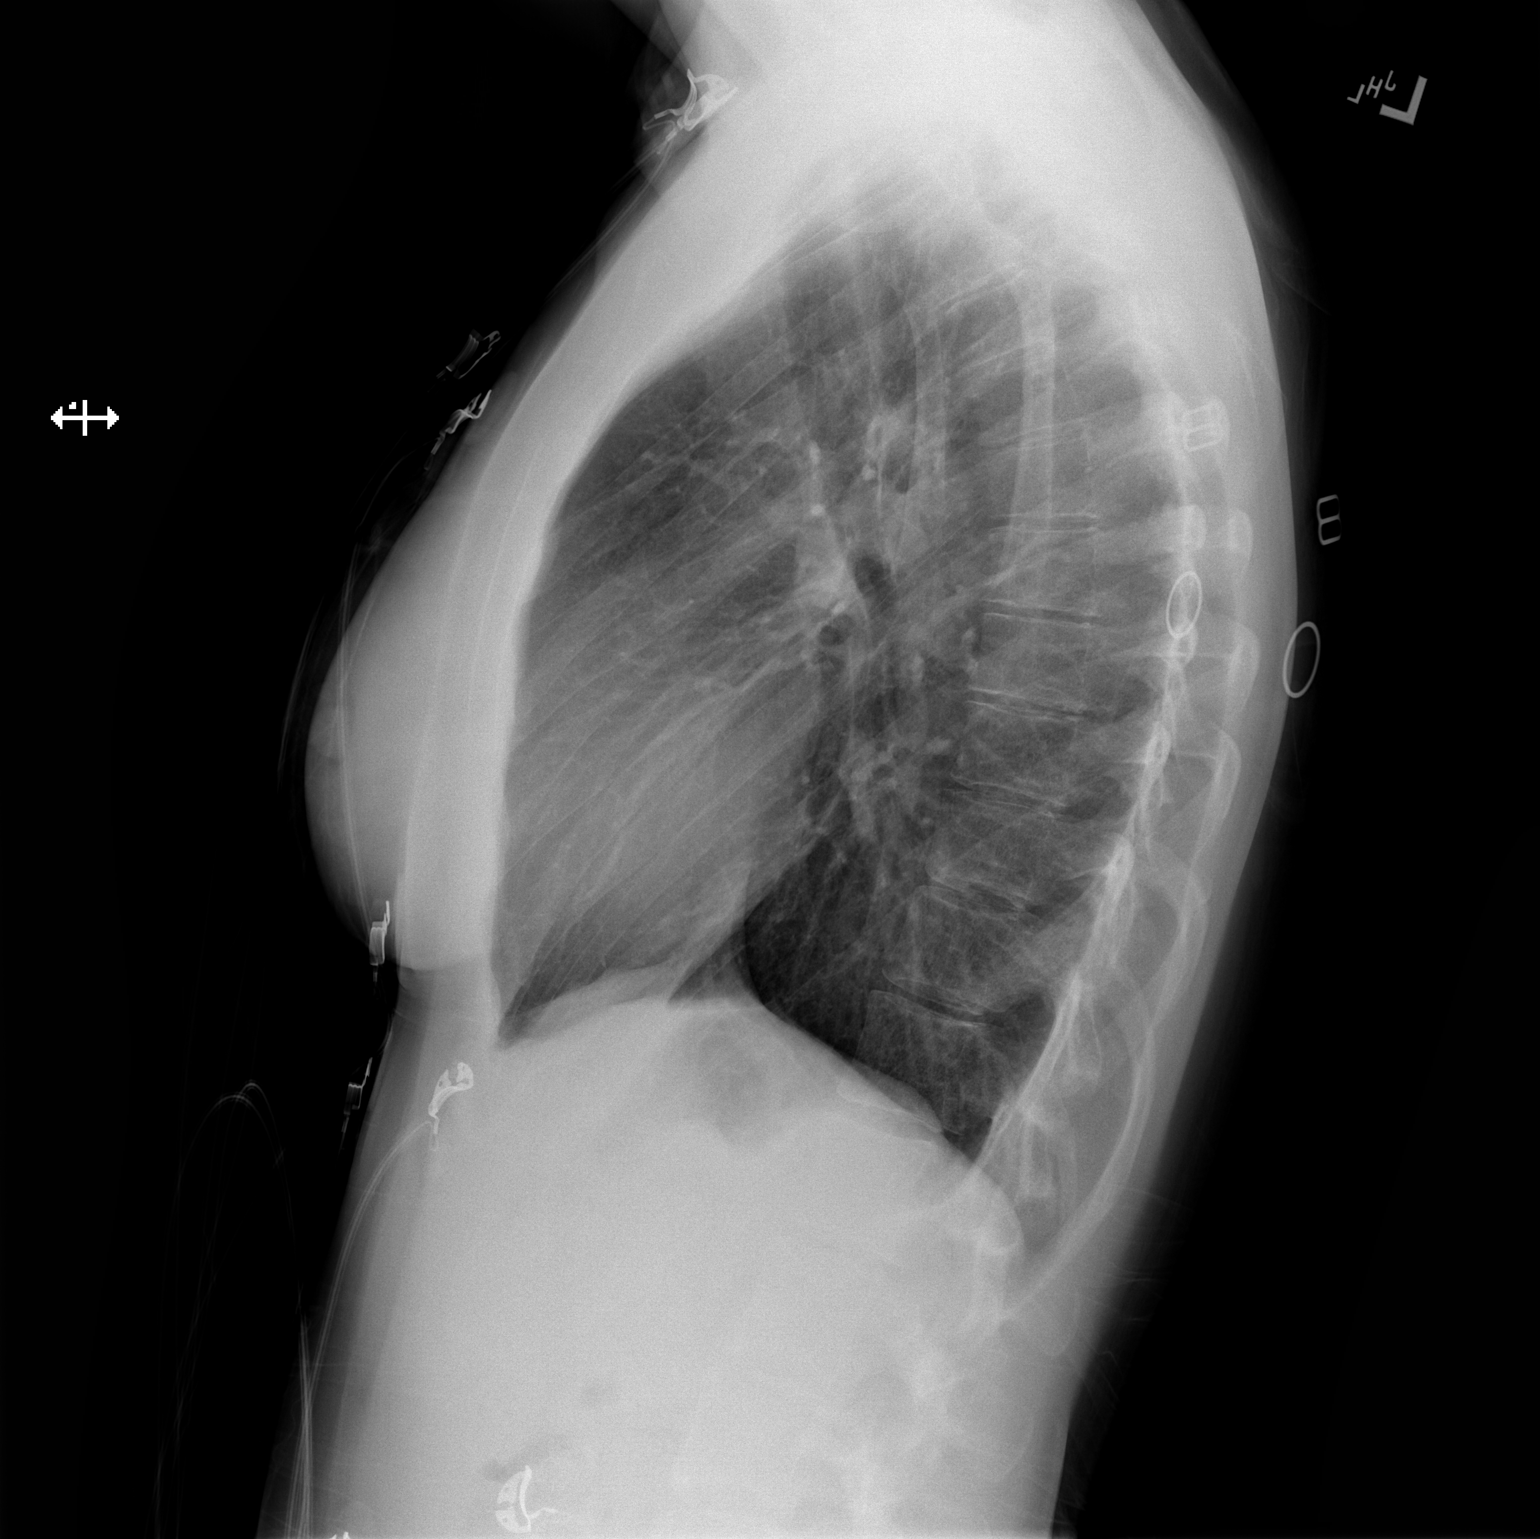

[2 of 2 positions shown; findings below may reference images not displayed]

FINDINGS: The heart size and mediastinal contours are within normal limits. No
focal airspace consolidation, pleural effusion, or pneumothorax. The
visualized skeletal structures are unremarkable.
IMPRESSION: No active cardiopulmonary disease.

## 2022-06-29 ENCOUNTER — Inpatient Hospital Stay (HOSPITAL_BASED_OUTPATIENT_CLINIC_OR_DEPARTMENT_OTHER): Payer: BC Managed Care – PPO

## 2022-06-29 ENCOUNTER — Inpatient Hospital Stay (HOSPITAL_COMMUNITY)
Admission: AD | Admit: 2022-06-29 | Discharge: 2022-06-29 | Disposition: A | Discharge status: Home / Self Care | Payer: BC Managed Care – PPO | Attending: Obstetrics and Gynecology | Admitting: Obstetrics and Gynecology

## 2022-06-29 ENCOUNTER — Encounter (HOSPITAL_COMMUNITY): Payer: Self-pay | Admitting: Obstetrics and Gynecology

## 2022-06-29 DIAGNOSIS — Y92008 Other place in unspecified non-institutional (private) residence as the place of occurrence of the external cause: Secondary | ICD-10-CM | POA: Diagnosis not present

## 2022-06-29 DIAGNOSIS — O9A213 Injury, poisoning and certain other consequences of external causes complicating pregnancy, third trimester: Secondary | ICD-10-CM

## 2022-06-29 DIAGNOSIS — Z7901 Long term (current) use of anticoagulants: Secondary | ICD-10-CM | POA: Diagnosis not present

## 2022-06-29 DIAGNOSIS — O26893 Other specified pregnancy related conditions, third trimester: Secondary | ICD-10-CM | POA: Insufficient documentation

## 2022-06-29 DIAGNOSIS — W500XXA Accidental hit or strike by another person, initial encounter: Secondary | ICD-10-CM

## 2022-06-29 DIAGNOSIS — Z880 Allergy status to penicillin: Secondary | ICD-10-CM | POA: Insufficient documentation

## 2022-06-29 DIAGNOSIS — Z86718 Personal history of other venous thrombosis and embolism: Secondary | ICD-10-CM | POA: Diagnosis not present

## 2022-06-29 DIAGNOSIS — Z3A32 32 weeks gestation of pregnancy: Secondary | ICD-10-CM

## 2022-06-29 DIAGNOSIS — R109 Unspecified abdominal pain: Secondary | ICD-10-CM | POA: Diagnosis not present
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
## 2022-07-30 LAB — OB RESULTS CONSOLE GBS: GBS: NEGATIVE

## 2022-08-09 ENCOUNTER — Inpatient Hospital Stay (HOSPITAL_COMMUNITY)
Admission: AD | Admit: 2022-08-09 | Discharge: 2022-08-09 | Disposition: A | Discharge status: Home / Self Care | Payer: BC Managed Care – PPO | Attending: Obstetrics & Gynecology | Admitting: Obstetrics & Gynecology

## 2022-08-09 ENCOUNTER — Encounter (HOSPITAL_COMMUNITY): Payer: Self-pay | Admitting: Obstetrics & Gynecology

## 2022-08-09 DIAGNOSIS — Z3A37 37 weeks gestation of pregnancy: Secondary | ICD-10-CM | POA: Diagnosis not present

## 2022-08-09 DIAGNOSIS — Z3689 Encounter for other specified antenatal screening: Secondary | ICD-10-CM | POA: Diagnosis not present

## 2022-08-09 DIAGNOSIS — O471 False labor at or after 37 completed weeks of gestation: Secondary | ICD-10-CM

## 2022-08-09 NOTE — MAU Note (Signed)
I have communicated with Gerrit Heck, CNM and reviewed vital signs:  Vitals:   08/09/22 0101 08/09/22 0241  BP: 123/78 126/70  Pulse: 99   Resp: 20   Temp: 98 F (36.7 C)     Vaginal exam:  Dilation:  (declined recheck) Effacement (%): 50 Exam by:: Tahsin Benyo Tereasa Yilmaz RN,   Also reviewed contraction pattern and that non-stress test is reactive.  It has been documented that patient is contracting every irregularly minutes with  unknown (patient declined recheck)  cervical change over 1 hour not indicating active labor.  Patient denies any other complaints.  Based on this report provider has given order for discharge.  A discharge order and diagnosis entered by a provider.   Labor discharge instructions reviewed with patient. Pain score a 2/10 at discharge

## 2022-08-09 NOTE — MAU Provider Note (Signed)
S: Ms. Kambry Takacs is a 32 y.o. G3P1011 at [redacted]w[redacted]d  who presents to MAU today for labor evaluation.   After 1 hours, patient declines repeat cervical exam and requests discharge.   Cervical exam by RN:  Dilation:  (declined recheck) Effacement (%): 50 Exam by:: Alondra Twitty RN  Fetal Monitoring: Baseline: 135 Variability: Moderate Accelerations: Present 15x15 Decelerations: None Contractions: Q7-76min  MDM Discussed patient with RN. NST reviewed.   A: SIUP at [redacted]w[redacted]d  False labor Cat I FT  P: NST Reactive Discharge home Labor precautions and kick counts included in AVS Patient to follow-up with primary office as scheduled  Patient may return to MAU as needed or when in labor   Gerrit Heck, [place] 08/09/2022 2:37 AM

## 2022-08-09 NOTE — MAU Note (Signed)
Pt says UC strong since 945pm-  Has hx DVT- takes Heparin - concerned- about next dose - Did not take. Sch for an induction on 08-18-2022 at [place]  [facility]  3 cm on Thursday  Denies HSV GBS- neg

## 2022-08-11 ENCOUNTER — Telehealth (HOSPITAL_COMMUNITY): Payer: Self-pay | Admitting: *Deleted

## 2022-08-11 ENCOUNTER — Encounter (HOSPITAL_COMMUNITY): Payer: Self-pay

## 2022-08-11 NOTE — Telephone Encounter (Signed)
Preadmission screen  

## 2022-08-13 ENCOUNTER — Telehealth (HOSPITAL_COMMUNITY): Payer: Self-pay | Admitting: *Deleted

## 2022-08-13 NOTE — Telephone Encounter (Signed)
Preadmission screen  

## 2022-08-14 ENCOUNTER — Encounter (HOSPITAL_COMMUNITY): Payer: Self-pay | Admitting: *Deleted

## 2022-08-14 ENCOUNTER — Telehealth (HOSPITAL_COMMUNITY): Payer: Self-pay | Admitting: *Deleted

## 2022-08-14 NOTE — Telephone Encounter (Signed)
Preadmission screen  

## 2022-08-15 NOTE — H&P (Signed)
Preadmission History and Physical - IOL  Kathryn Gill is a 32 y.o. G3P1011 at [redacted]w[redacted]d presenting for induction of labor. Has hx DVT, switched from ppx lovenox to heparin at 36wga. She was instructed to discontinue heparin after night time dose the evening before admission. 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 Also has hypothyroidism, on synthroid. Hx urinary retention early this pregnancy, resolved.  OB History     Gravida  3   Para  1   Term  1   Preterm      AB  1   Living  1      SAB  1   IAB      Ectopic      Multiple      Live Births  1          Past Medical History:  Diagnosis Date   DVT (deep vein thrombosis) in pregnancy    PONV (postoperative nausea and vomiting)    Thyroid disease    Past Surgical History:  Procedure Laterality Date   FOOT SURGERY Left    Family History: family history is not on file. Social History:  reports that she has never smoked. She has never used smokeless tobacco. She reports that she does not currently use alcohol. She reports that she does not use drugs.     Maternal Diabetes: No Genetic Screening: Normal Maternal Ultrasounds/Referrals: Normal Fetal Ultrasounds or other Referrals:  None Maternal Substance Abuse:  No Significant Maternal Medications:  Synthroid, Heparin Significant Maternal Lab Results:  Group B Strep negative Number of Prenatal Visits:greater than 3 verified prenatal visits Other Comments:  None  Vitals on admission. Reviewed from office visits, wnl.  Constitutional:      Appearance: Normal appearance.  HENT:     Head: Normocephalic.  Eyes:     Pupils: Pupils are equal, round. Cardiovascular:     Rate and Rhythm: Normal rate.    Pulses: Normal pulses.  Abdominal:     General: Abdomen is Gravid, nontender Neurological:     Mental Status: She is alert.    Cervix was last 3/70/-2 in the office, vertex presentation.  Prenatal labs: ABO, Rh: A/Positive/-- (01/02 0000) Antibody: Negative (01/02  0000) Rubella: Immune (01/02 0000) RPR: Nonreactive (01/02 0000)  HBsAg: Negative (01/02 0000)  HIV: Non-reactive (01/02 0000)  GBS: Negative/-- (07/10 0000)   Assessment/Plan: 32 y.o. G3P1011 at [redacted]w[redacted]d presenting for IOL for anticoagulation discontinuation timing. Cervix favorable. Plan for Pitocin/AROM. Hx DVT - has been on heparin, last dose Saturday evening. For lovenox pp x6 weeks. Hypothyroid - continue home synthroid GBS neg  Kathryn Gill 08/15/2022, 11:31 AM

## 2022-08-18 ENCOUNTER — Inpatient Hospital Stay (HOSPITAL_COMMUNITY): Payer: BC Managed Care – PPO | Admitting: Anesthesiology

## 2022-08-18 ENCOUNTER — Other Ambulatory Visit: Payer: Self-pay

## 2022-08-18 ENCOUNTER — Inpatient Hospital Stay (HOSPITAL_COMMUNITY)
Admission: RE | Admit: 2022-08-18 | Discharge: 2022-08-19 | DRG: 807 | Disposition: A | Discharge status: Home / Self Care | Payer: BC Managed Care – PPO | Attending: Obstetrics and Gynecology | Admitting: Obstetrics and Gynecology

## 2022-08-18 ENCOUNTER — Inpatient Hospital Stay (HOSPITAL_COMMUNITY)
Admission: RE | Admit: 2022-08-18 | Discharge: 2022-08-18 | Disposition: A | Discharge status: Home / Self Care | Payer: BC Managed Care – PPO | Source: Ambulatory Visit | Attending: Obstetrics and Gynecology | Admitting: Obstetrics and Gynecology

## 2022-08-18 ENCOUNTER — Encounter (HOSPITAL_COMMUNITY): Payer: Self-pay | Admitting: Obstetrics and Gynecology

## 2022-08-18 DIAGNOSIS — E039 Hypothyroidism, unspecified: Secondary | ICD-10-CM | POA: Diagnosis present

## 2022-08-18 DIAGNOSIS — O99284 Endocrine, nutritional and metabolic diseases complicating childbirth: Secondary | ICD-10-CM | POA: Diagnosis present

## 2022-08-18 DIAGNOSIS — Z7901 Long term (current) use of anticoagulants: Secondary | ICD-10-CM

## 2022-08-18 DIAGNOSIS — Z86718 Personal history of other venous thrombosis and embolism: Secondary | ICD-10-CM | POA: Diagnosis not present

## 2022-08-18 DIAGNOSIS — Z3A39 39 weeks gestation of pregnancy: Secondary | ICD-10-CM

## 2022-08-18 DIAGNOSIS — Z3490 Encounter for supervision of normal pregnancy, unspecified, unspecified trimester: Principal | ICD-10-CM

## 2022-08-18 DIAGNOSIS — O26893 Other specified pregnancy related conditions, third trimester: Secondary | ICD-10-CM | POA: Diagnosis present

## 2022-08-18 LAB — CBC
HCT: 33.2 % — ABNORMAL LOW (ref 36.0–46.0)
HCT: 33 % — ABNORMAL LOW (ref 36.0–46.0)
Hemoglobin: 10.6 g/dL — ABNORMAL LOW (ref 12.0–15.0)
Hemoglobin: 10.8 g/dL — ABNORMAL LOW (ref 12.0–15.0)
MCH: 25.2 pg — ABNORMAL LOW (ref 26.0–34.0)
MCH: 26 pg (ref 26.0–34.0)
MCHC: 31.9 g/dL (ref 30.0–36.0)
MCHC: 32.7 g/dL (ref 30.0–36.0)
MCV: 79 fL — ABNORMAL LOW (ref 80.0–100.0)
MCV: 79.5 fL — ABNORMAL LOW (ref 80.0–100.0)
Platelets: 186 10*3/uL (ref 150–400)
Platelets: 159 10*3/uL (ref 150–400)
RBC: 4.2 MIL/uL (ref 3.87–5.11)
RBC: 4.15 MIL/uL (ref 3.87–5.11)
RDW: 15.1 % (ref 11.5–15.5)
RDW: 14.8 % (ref 11.5–15.5)
WBC: 10.6 10*3/uL — ABNORMAL HIGH (ref 4.0–10.5)
WBC: 15.8 10*3/uL — ABNORMAL HIGH (ref 4.0–10.5)
nRBC: 0.3 % — ABNORMAL HIGH (ref 0.0–0.2)
nRBC: 0 % (ref 0.0–0.2)

## 2022-08-18 LAB — CREATININE, SERUM
Creatinine, Ser: 0.66 mg/dL (ref 0.44–1.00)
GFR, Estimated: >60 mL/min (ref >60)

## 2022-08-18 LAB — TYPE AND SCREEN
ABO/RH(D): A POS
Antibody Screen: NEGATIVE

## 2022-08-18 LAB — RPR: RPR Ser Ql: NONREACTIVE

## 2022-08-18 MED ORDER — ZOLPIDEM TARTRATE 5 MG PO TABS: 5.0000 mg | ORAL_TABLET | Freq: Every evening | ORAL | Status: DC | PRN

## 2022-08-18 MED ORDER — DIPHENHYDRAMINE HCL 25 MG PO CAPS: 25.0000 mg | ORAL_CAPSULE | Freq: Four times a day (QID) | ORAL | Status: DC | PRN

## 2022-08-18 MED ORDER — DIPHENHYDRAMINE HCL 50 MG/ML IJ SOLN: 12.5000 mg | INTRAMUSCULAR | Status: DC | PRN

## 2022-08-18 MED ORDER — PRENATAL MULTIVITAMIN CH
1.0000 | ORAL_TABLET | Freq: Every day | ORAL | Status: DC
  Administered 2022-08-19: 1 via ORAL
  Filled 2022-08-18: qty 1

## 2022-08-18 MED ORDER — WITCH HAZEL-GLYCERIN EX PADS: 1.0000 | MEDICATED_PAD | CUTANEOUS | Status: DC | PRN

## 2022-08-18 MED ORDER — ONDANSETRON HCL 4 MG/2ML IJ SOLN: 4.0000 mg | INTRAMUSCULAR | Status: DC | PRN

## 2022-08-18 MED ORDER — BENZOCAINE-MENTHOL 20-0.5 % EX AERO
1.0000 | INHALATION_SPRAY | CUTANEOUS | Status: DC | PRN
  Administered 2022-08-18: 1 via TOPICAL

## 2022-08-18 MED ORDER — FENTANYL CITRATE (PF) 100 MCG/2ML IJ SOLN
INTRAMUSCULAR | Status: DC | PRN
  Administered 2022-08-18: 100 ug via EPIDURAL

## 2022-08-18 MED ORDER — FENTANYL-BUPIVACAINE-NACL 0.5-0.125-0.9 MG/250ML-% EP SOLN
EPIDURAL | Status: DC | PRN
  Administered 2022-08-18: 12 mL/h via EPIDURAL

## 2022-08-18 MED ORDER — EPHEDRINE 5 MG/ML INJ: 10.0000 mg | INTRAVENOUS | Status: DC | PRN

## 2022-08-18 MED ORDER — PHENYLEPHRINE 80 MCG/ML (10ML) SYRINGE FOR IV PUSH (FOR BLOOD PRESSURE SUPPORT): 80.0000 ug | PREFILLED_SYRINGE | INTRAVENOUS | Status: DC | PRN

## 2022-08-18 MED ORDER — FENTANYL-BUPIVACAINE-NACL 0.5-0.125-0.9 MG/250ML-% EP SOLN
12.0000 mL/h | EPIDURAL | Status: DC | PRN
  Filled 2022-08-18: qty 250

## 2022-08-18 MED ORDER — ONDANSETRON HCL 4 MG PO TABS: 4.0000 mg | ORAL_TABLET | ORAL | Status: DC | PRN

## 2022-08-18 MED ORDER — OXYCODONE HCL 5 MG PO TABS: 5.0000 mg | ORAL_TABLET | ORAL | Status: DC | PRN

## 2022-08-18 MED ORDER — PHENYLEPHRINE 80 MCG/ML (10ML) SYRINGE FOR IV PUSH (FOR BLOOD PRESSURE SUPPORT)
80.0000 ug | PREFILLED_SYRINGE | INTRAVENOUS | Status: DC | PRN
  Filled 2022-08-18: qty 10

## 2022-08-18 MED ORDER — LIDOCAINE HCL (PF) 1 % IJ SOLN: 30.0000 mL | INTRAMUSCULAR | Status: DC | PRN

## 2022-08-18 MED ORDER — LACTATED RINGERS IV SOLN
500.0000 mL | Freq: Once | INTRAVENOUS | Status: AC
  Administered 2022-08-18: 500 mL via INTRAVENOUS

## 2022-08-18 MED ORDER — ACETAMINOPHEN 325 MG PO TABS: 650.0000 mg | ORAL_TABLET | ORAL | Status: DC | PRN

## 2022-08-18 MED ORDER — TERBUTALINE SULFATE 1 MG/ML IJ SOLN: 0.2500 mg | Freq: Once | INTRAMUSCULAR | Status: DC | PRN

## 2022-08-18 MED ORDER — SIMETHICONE 80 MG PO CHEW: 80.0000 mg | CHEWABLE_TABLET | ORAL | Status: DC | PRN

## 2022-08-18 MED ORDER — LACTATED RINGERS IV SOLN: INTRAVENOUS | Status: DC

## 2022-08-18 MED ORDER — COCONUT OIL OIL: 1.0000 | TOPICAL_OIL | Status: DC | PRN

## 2022-08-18 MED ORDER — SENNOSIDES-DOCUSATE SODIUM 8.6-50 MG PO TABS
2.0000 | ORAL_TABLET | Freq: Every day | ORAL | Status: DC
  Administered 2022-08-19: 2 via ORAL
  Filled 2022-08-18: qty 2

## 2022-08-18 MED ORDER — LIDOCAINE HCL (PF) 1 % IJ SOLN
INTRAMUSCULAR | Status: DC | PRN
  Administered 2022-08-18: 2 mL via EPIDURAL
  Administered 2022-08-18: 10 mL via EPIDURAL

## 2022-08-18 MED ORDER — ONDANSETRON HCL 4 MG/2ML IJ SOLN: 4.0000 mg | Freq: Four times a day (QID) | INTRAMUSCULAR | Status: DC | PRN

## 2022-08-18 MED ORDER — IBUPROFEN 600 MG PO TABS
600.0000 mg | ORAL_TABLET | Freq: Four times a day (QID) | ORAL | Status: DC
  Administered 2022-08-18 – 2022-08-19 (×4): 600 mg via ORAL
  Filled 2022-08-18 (×4): qty 1

## 2022-08-18 MED ORDER — FENTANYL CITRATE (PF) 100 MCG/2ML IJ SOLN
50.0000 ug | INTRAMUSCULAR | Status: DC | PRN
  Filled 2022-08-18: qty 2

## 2022-08-18 MED ORDER — SOD CITRATE-CITRIC ACID 500-334 MG/5ML PO SOLN: 30.0000 mL | ORAL | Status: DC | PRN

## 2022-08-18 MED ORDER — LEVOTHYROXINE SODIUM 50 MCG PO TABS
25.0000 ug | ORAL_TABLET | Freq: Every day | ORAL | Status: DC
  Administered 2022-08-18 – 2022-08-19 (×2): 25 ug via ORAL
  Filled 2022-08-18 (×2): qty 1

## 2022-08-18 MED ORDER — HYDROXYZINE HCL 50 MG PO TABS: 50.0000 mg | ORAL_TABLET | Freq: Four times a day (QID) | ORAL | Status: DC | PRN

## 2022-08-18 MED ORDER — LACTATED RINGERS IV SOLN: 500.0000 mL | INTRAVENOUS | Status: DC | PRN

## 2022-08-18 MED ORDER — ENOXAPARIN SODIUM 40 MG/0.4ML IJ SOSY
40.0000 mg | PREFILLED_SYRINGE | INTRAMUSCULAR | Status: DC
  Administered 2022-08-19: 40 mg via SUBCUTANEOUS
  Filled 2022-08-18 (×2): qty 0.4

## 2022-08-18 MED ORDER — DIBUCAINE (PERIANAL) 1 % EX OINT: 1.0000 | TOPICAL_OINTMENT | CUTANEOUS | Status: DC | PRN

## 2022-08-18 MED ORDER — TETANUS-DIPHTH-ACELL PERTUSSIS 5-2.5-18.5 LF-MCG/0.5 IM SUSY: 0.5000 mL | PREFILLED_SYRINGE | Freq: Once | INTRAMUSCULAR | Status: DC

## 2022-08-18 MED ORDER — OXYTOCIN BOLUS FROM INFUSION
333.0000 mL | Freq: Once | INTRAVENOUS | Status: DC
  Administered 2022-08-18: 333 mL via INTRAVENOUS

## 2022-08-18 MED ORDER — OXYTOCIN-SODIUM CHLORIDE 30-0.9 UT/500ML-% IV SOLN
1.0000 m[IU]/min | INTRAVENOUS | Status: DC
  Administered 2022-08-18: 2 m[IU]/min via INTRAVENOUS
  Filled 2022-08-18: qty 500

## 2022-08-18 MED ORDER — LEVOTHYROXINE SODIUM 25 MCG PO TABS: 25.0000 ug | ORAL_TABLET | Freq: Every day | ORAL | Status: DC

## 2022-08-18 MED ORDER — BUPIVACAINE HCL (PF) 0.25 % IJ SOLN
INTRAMUSCULAR | Status: DC | PRN
  Administered 2022-08-18: 8 mL via EPIDURAL

## 2022-08-18 MED ORDER — OXYTOCIN-SODIUM CHLORIDE 30-0.9 UT/500ML-% IV SOLN: 2.5000 [IU]/h | INTRAVENOUS | Status: DC

## 2022-08-18 NOTE — Progress Notes (Signed)
Labor Progress Note  Comfortable s/p epidural. Cervix 5/80/-2, AROMed for clear fluid. Continue pitocin. FHT reassuring. We discussed anticoagulation timing - she reports her hematologist initially had recommended lifelong anticoagulation, but has since recommended postpartum lovenox and then poss discontinuation of anticoagulation. Elesa will discuss with her further postpartum.  Jule Economy, MD

## 2022-08-18 NOTE — Anesthesia Preprocedure Evaluation (Signed)
Anesthesia Evaluation  Patient identified by MRN, date of birth, ID band Patient awake    Reviewed: Allergy & Precautions, Patient's Chart, lab work & pertinent test results  History of Anesthesia Complications (+) PONV and history of anesthetic complications  Airway Mallampati: II  TM Distance: >3 FB Neck ROM: Full    Dental no notable dental hx.    Pulmonary neg pulmonary ROS   Pulmonary exam normal breath sounds clear to auscultation       Cardiovascular + DVT (hx DVT last postpartum period- LD heparin 48h ago)  Normal cardiovascular exam Rhythm:Regular Rate:Normal     Neuro/Psych  PSYCHIATRIC DISORDERS Anxiety     negative neurological ROS     GI/Hepatic negative GI ROS, Neg liver ROS,,,  Endo/Other  Hypothyroidism    Renal/GU negative Renal ROS  negative genitourinary   Musculoskeletal negative musculoskeletal ROS (+)    Abdominal   Peds negative pediatric ROS (+)  Hematology negative hematology ROS (+)   Anesthesia Other Findings   Reproductive/Obstetrics (+) Pregnancy                             Anesthesia Physical Anesthesia Plan  ASA: 2  Anesthesia Plan: Epidural   Post-op Pain Management:    Induction:   PONV Risk Score and Plan: 2  Airway Management Planned: Natural Airway  Additional Equipment: None  Intra-op Plan:   Post-operative Plan:   Informed Consent: I have reviewed the patients History and Physical, chart, labs and discussed the procedure including the risks, benefits and alternatives for the proposed anesthesia with the patient or authorized representative who has indicated his/her understanding and acceptance.       Plan Discussed with:   Anesthesia Plan Comments:        Anesthesia Quick Evaluation

## 2022-08-18 NOTE — Lactation Note (Signed)
This note was copied from a baby's chart. Lactation Consultation Note Experienced BF mom stated this baby has been cluster feeding since delivery. Mom stated he is BF so well not having any trouble or concerns. Reviewed newborn BF verses toddler BF as reminder. Mom thanked LC. Discussed positioning, body alignment, support. Mom encouraged to feed baby 8-12 times/24 hours and with feeding cues.  Answered questions parents had. Asked mom if OK to put her PRN since since she is doing so well, if she needs Lactation she can call for assistance. Mom stated sure she feels good about how BF is going so far.  Patient Name: Kathryn Gill Today's Date: 08/18/2022 Age:52 hours Reason for consult: Initial assessment;Term;Maternal endocrine disorder   Maternal Data Does the patient have breastfeeding experience prior to this delivery?: Yes How long did the patient breastfeed?: 14 months to her now 32 yr old  Feeding    LATCH Score                    Lactation Tools Discussed/Used    Interventions Interventions: Breast feeding basics reviewed;LC Services brochure  Discharge    Consult Status Consult Status: PRN    Charyl Dancer 08/18/2022, 10:24 PM

## 2022-08-18 NOTE — Anesthesia Procedure Notes (Signed)
Epidural Patient location during procedure: OB Start time: 08/18/2022 8:19 AM End time: 08/18/2022 8:30 AM  Staffing Anesthesiologist: Lannie Fields, DO Performed: anesthesiologist   Preanesthetic Checklist Completed: patient identified, IV checked, risks and benefits discussed, monitors and equipment checked, pre-op evaluation and timeout performed  Epidural Patient position: sitting Prep: DuraPrep and site prepped and draped Patient monitoring: continuous pulse ox, blood pressure, heart rate and cardiac monitor Approach: midline Location: L3-L4 Injection technique: LOR air  Needle:  Needle type: Tuohy  Needle gauge: 17 G Needle length: 9 cm Needle insertion depth: 5.5 cm Catheter type: closed end flexible Catheter size: 19 Gauge Catheter at skin depth: 11 cm Test dose: negative  Assessment Sensory level: T8 Events: blood not aspirated, no cerebrospinal fluid, injection not painful, no injection resistance, no paresthesia and negative IV test  Additional Notes Patient identified. Risks/Benefits/Options discussed with patient including but not limited to bleeding, infection, nerve damage, paralysis, failed block, incomplete pain control, headache, blood pressure changes, nausea, vomiting, reactions to medication both or allergic, itching and postpartum back pain. Confirmed with bedside nurse the patient's most recent platelet count. Confirmed with patient that they are not currently taking any anticoagulation, have any bleeding history or any family history of bleeding disorders. Patient expressed understanding and wished to proceed. All questions were answered. Sterile technique was used throughout the entire procedure. Please see nursing notes for vital signs. Test dose was given through epidural catheter and negative prior to continuing to dose epidural or start infusion. Warning signs of high block given to the patient including shortness of breath, tingling/numbness in  hands, complete motor block, or any concerning symptoms with instructions to call for help. Patient was given instructions on fall risk and not to get out of bed. All questions and concerns addressed with instructions to call with any issues or inadequate analgesia.  Reason for block:procedure for pain

## 2022-08-19 MED ORDER — LEVOTHYROXINE SODIUM 25 MCG PO TABS: 25.0000 ug | ORAL_TABLET | Freq: Every day | ORAL | 0 refills | Status: AC

## 2022-08-19 MED ORDER — ENOXAPARIN SODIUM 40 MG/0.4ML IJ SOSY: 40.0000 mg | PREFILLED_SYRINGE | INTRAMUSCULAR | 1 refills | Status: AC

## 2022-08-19 MED ORDER — ACETAMINOPHEN 325 MG PO TABS: 650.0000 mg | ORAL_TABLET | Freq: Four times a day (QID) | ORAL | 0 refills | Status: AC | PRN

## 2022-08-19 MED ORDER — IBUPROFEN 600 MG PO TABS: 600.0000 mg | ORAL_TABLET | Freq: Four times a day (QID) | ORAL | 0 refills | Status: AC | PRN

## 2022-08-19 NOTE — Progress Notes (Signed)
Post Partum Day 1 Subjective: no complaints, up ad lib, voiding, tolerating PO, and + flatus  Objective: Blood pressure 122/85, pulse 74, temperature 98 F (36.7 C), temperature source Oral, resp. rate 20, height 5\' 7"  (1.702 m), weight 81.8 kg, last menstrual period 11/17/2021, SpO2 100%, unknown if currently breastfeeding.  Physical Exam:  General: alert, cooperative, and no distress Lochia: appropriate Uterine Fundus: firm Incision: healing well DVT Evaluation: No evidence of DVT seen on physical exam.  Recent Labs    08/18/22 1345 08/19/22 0548  HGB 10.8* 11.1*  HCT 33.0* 35.4*    Assessment/Plan: Plan for discharge tomorrow Discharge instructions reviewed DW circumcision of newborn boy baby, risks reviewed. She states she understands and wants to proceed.   LOS: 1 day   Leslie Andrea, MD 08/19/2022, 7:16 AM

## 2022-08-19 NOTE — Discharge Summary (Signed)
Postpartum Discharge Summary  Date of Service updated7/30/24     Patient Name: Kathryn Gill DOB: [birth year] MRN: [id number]  Date of admission: 08/18/2022 Delivery date:08/18/2022 Delivering provider: Tawni Levy Date of discharge: 08/19/2022  Admitting diagnosis: Term pregnancy [Z34.90] Intrauterine pregnancy: [redacted]w[redacted]d     Secondary diagnosis:  Principal Problem:   Term pregnancy  Additional problems:     Discharge diagnosis: Term Pregnancy Delivered                                              Post partum procedures: Augmentation: AROM and Pitocin Complications: None  Hospital course: Induction of Labor With Vaginal Delivery   32 y.o. yo [id number] at [redacted]w[redacted]d was admitted to the hospital 08/18/2022 for induction of labor.  Indication for induction: Favorable cervix at term and history of DVT .  Patient had an labor course complicated by Membrane Rupture Time/Date: 9:34 AM,08/18/2022  Delivery Method:Vaginal, Spontaneous Operative Delivery:N/A Episiotomy: None Lacerations:  1st degree Details of delivery can be found in separate delivery note.  Patient had a postpartum course complicated by. Patient is discharged home 08/19/22.  Newborn Data: Birth date:08/18/2022 Birth time:11:41 AM Gender:Female Living status:Living Apgars:8 ,9  Weight:3750 g  Magnesium Sulfate received: No BMZ received: No Rhophylac:No MMR:No T-DaP:Given prenatally Flu: No Transfusion:No  Physical exam  Vitals:   08/18/22 1420 08/18/22 1920 08/18/22 2323 08/19/22 0544  BP: 121/79 107/68 123/76 122/85  Pulse: 89 85 86 74  Resp: 20 20 20 20   Temp:   98.7 F (37.1 C) 98 F (36.7 C)  TempSrc:  Oral Oral Oral  SpO2:   98% 100%  Weight:      Height:       General: alert, cooperative, and no distress Lochia: appropriate Uterine Fundus: firm Incision: Healing well with no significant drainage DVT Evaluation: No evidence of DVT seen on physical exam. Labs: Lab Results  Component Value Date    WBC 11.9 (H) 08/19/2022   HGB 11.1 (L) 08/19/2022   HCT 35.4 (L) 08/19/2022   MCV 80.3 08/19/2022   PLT 170 08/19/2022      Latest Ref Rng & Units 08/18/2022    1:45 PM  CMP  Creatinine 0.44 - 1.00 mg/dL 8.75    Edinburgh Score:    08/18/2022    1:20 PM  Edinburgh Postnatal Depression Scale Screening Tool  I have been able to laugh and see the funny side of things. --      After visit meds:  Allergies as of 08/19/2022       Reactions   Penicillins Anaphylaxis        Medication List     TAKE these medications    acetaminophen 325 MG tablet Commonly known as: Tylenol Take 2 tablets (650 mg total) by mouth every 6 (six) hours as needed (for pain scale < 4).   enoxaparin 40 MG/0.4ML injection Commonly known as: LOVENOX Inject 0.4 mLs (40 mg total) into the skin daily. What changed:  medication strength how much to take when to take this   ibuprofen 600 MG tablet Commonly known as: ADVIL Take 1 tablet (600 mg total) by mouth every 6 (six) hours as needed.   levothyroxine 25 MCG tablet Commonly known as: SYNTHROID Take 1 tablet (25 mcg total) by mouth daily at 6 (six) AM. Start taking on: August 20, 2022 What changed:  when to take this         Discharge home in stable condition Infant Feeding: Breast Infant Disposition:home with mother Discharge instruction: per After Visit Summary and Postpartum booklet. Activity: Advance as tolerated. Pelvic rest for 6 weeks.  Diet: routine diet Anticipated Birth Control: Unsure Postpartum Appointment:6 weeks Additional Postpartum F/U:  Future Appointments:No future appointments. Follow up Visit:      08/19/2022 Leslie Andrea, MD

## 2022-08-19 NOTE — Anesthesia Postprocedure Evaluation (Signed)
Anesthesia Post Note  Patient: [name]  Procedure(s) Performed: AN AD HOC LABOR EPIDURAL     Patient location during evaluation: Mother Baby Anesthesia Type: Epidural Level of consciousness: awake Pain management: satisfactory to patient Vital Signs Assessment: post-procedure vital signs reviewed and stable Respiratory status: spontaneous breathing Cardiovascular status: stable Anesthetic complications: no  No notable events documented.  Last Vitals:  Vitals:   08/18/22 2323 08/19/22 0544  BP: 123/76 122/85  Pulse: 86 74  Resp: 20 20  Temp: 37.1 C 36.7 C  SpO2: 98% 100%    Last Pain:  Vitals:   08/19/22 0734  TempSrc:   PainSc: 0-No pain   Pain Goal: Patients Stated Pain Goal: 8 (08/18/22 8657)                 Cephus Shelling

## 2022-09-15 ENCOUNTER — Telehealth (HOSPITAL_COMMUNITY): Payer: Self-pay

## 2022-09-15 NOTE — Telephone Encounter (Signed)
09/15/2022 [id number]  Name: Kathryn Gill MRN: [id number] DOB: 03/09/1990  Reason for Call:  Transition of Care Hospital Discharge Call  Contact Status: Patient Contact Status: Message  Language assistant needed: Interpreter Mode: Interpreter Not Needed        Follow-Up Questions:    Edinburgh Postnatal Depression Scale:  In the Past 7 Days:    PHQ2-9 Depression Scale:     Discharge Follow-up:    Post-discharge interventions: NA  Signature  Signe Colt
